# Patient Record
Sex: Male | Born: 1980 | Race: White | Hispanic: No | Marital: Married | Smoking: Current some day smoker
Health system: Southern US, Community
[De-identification: ages and names within clinical notes are randomized; demographics above are authoritative.]

## PROBLEM LIST (undated history)

## (undated) DIAGNOSIS — N200 Calculus of kidney: Secondary | ICD-10-CM

## (undated) DIAGNOSIS — Z7289 Other problems related to lifestyle: Secondary | ICD-10-CM

## (undated) DIAGNOSIS — F32A Depression, unspecified: Secondary | ICD-10-CM

## (undated) DIAGNOSIS — F419 Anxiety disorder, unspecified: Secondary | ICD-10-CM

## (undated) DIAGNOSIS — F1910 Other psychoactive substance abuse, uncomplicated: Secondary | ICD-10-CM

## (undated) DIAGNOSIS — G4730 Sleep apnea, unspecified: Secondary | ICD-10-CM

## (undated) DIAGNOSIS — Z0282 Encounter for adoption services: Secondary | ICD-10-CM

## (undated) DIAGNOSIS — R51 Headache: Secondary | ICD-10-CM

## (undated) DIAGNOSIS — K219 Gastro-esophageal reflux disease without esophagitis: Secondary | ICD-10-CM

## (undated) DIAGNOSIS — Z87442 Personal history of urinary calculi: Secondary | ICD-10-CM

## (undated) DIAGNOSIS — Z789 Other specified health status: Secondary | ICD-10-CM

## (undated) DIAGNOSIS — F329 Major depressive disorder, single episode, unspecified: Secondary | ICD-10-CM

## (undated) DIAGNOSIS — C801 Malignant (primary) neoplasm, unspecified: Secondary | ICD-10-CM

## (undated) DIAGNOSIS — I10 Essential (primary) hypertension: Secondary | ICD-10-CM

## (undated) DIAGNOSIS — T4145XA Adverse effect of unspecified anesthetic, initial encounter: Secondary | ICD-10-CM

## (undated) DIAGNOSIS — R519 Headache, unspecified: Secondary | ICD-10-CM

## (undated) DIAGNOSIS — G510 Bell's palsy: Secondary | ICD-10-CM

## (undated) DIAGNOSIS — E291 Testicular hypofunction: Secondary | ICD-10-CM

## (undated) DIAGNOSIS — N50819 Testicular pain, unspecified: Secondary | ICD-10-CM

## (undated) DIAGNOSIS — T7840XA Allergy, unspecified, initial encounter: Secondary | ICD-10-CM

## (undated) DIAGNOSIS — T8859XA Other complications of anesthesia, initial encounter: Secondary | ICD-10-CM

## (undated) DIAGNOSIS — M1990 Unspecified osteoarthritis, unspecified site: Secondary | ICD-10-CM

## (undated) DIAGNOSIS — F319 Bipolar disorder, unspecified: Secondary | ICD-10-CM

## (undated) HISTORY — DX: Bipolar disorder, unspecified: F31.9

## (undated) HISTORY — DX: Allergy, unspecified, initial encounter: T78.40XA

## (undated) HISTORY — DX: Other specified health status: Z78.9

## (undated) HISTORY — DX: Depression, unspecified: F32.A

## (undated) HISTORY — DX: Other problems related to lifestyle: Z72.89

## (undated) HISTORY — DX: Testicular hypofunction: E29.1

## (undated) HISTORY — DX: Calculus of kidney: N20.0

## (undated) HISTORY — DX: Encounter for adoption services: Z02.82

## (undated) HISTORY — DX: Other psychoactive substance abuse, uncomplicated: F19.10

## (undated) HISTORY — DX: Malignant (primary) neoplasm, unspecified: C80.1

## (undated) HISTORY — DX: Major depressive disorder, single episode, unspecified: F32.9

## (undated) HISTORY — DX: Essential (primary) hypertension: I10

## (undated) HISTORY — PX: CHOLECYSTECTOMY: SHX55

## (undated) HISTORY — DX: Testicular pain, unspecified: N50.819

## (undated) HISTORY — DX: Gastro-esophageal reflux disease without esophagitis: K21.9

## (undated) HISTORY — DX: Bell's palsy: G51.0

---

## 2000-12-09 HISTORY — PX: TONSILLECTOMY AND ADENOIDECTOMY: SUR1326

## 2002-07-31 ENCOUNTER — Emergency Department (HOSPITAL_COMMUNITY): Admission: EM | Admit: 2002-07-31 | Discharge: 2002-07-31 | Payer: Self-pay | Admitting: Emergency Medicine

## 2007-11-26 ENCOUNTER — Emergency Department: Payer: Self-pay | Admitting: Emergency Medicine

## 2007-11-26 ENCOUNTER — Other Ambulatory Visit: Payer: Self-pay

## 2008-09-25 ENCOUNTER — Emergency Department: Payer: Self-pay | Admitting: Emergency Medicine

## 2010-07-12 ENCOUNTER — Emergency Department: Payer: Self-pay | Admitting: Emergency Medicine

## 2011-09-01 ENCOUNTER — Emergency Department: Payer: Self-pay | Admitting: Emergency Medicine

## 2011-09-04 ENCOUNTER — Ambulatory Visit: Payer: Self-pay | Admitting: Internal Medicine

## 2011-09-19 ENCOUNTER — Ambulatory Visit: Payer: Self-pay | Admitting: Cardiology

## 2012-01-21 ENCOUNTER — Ambulatory Visit: Payer: Self-pay | Admitting: Cardiology

## 2012-03-04 ENCOUNTER — Inpatient Hospital Stay: Payer: Self-pay | Admitting: Psychiatry

## 2012-03-04 LAB — COMPREHENSIVE METABOLIC PANEL
Albumin: 4.3 g/dL (ref 3.4–5.0)
Alkaline Phosphatase: 51 U/L (ref 50–136)
BUN: 12 mg/dL (ref 7–18)
Calcium, Total: 8.7 mg/dL (ref 8.5–10.1)
Chloride: 106 mmol/L (ref 98–107)
Co2: 25 mmol/L (ref 21–32)
EGFR (African American): >60
Glucose: 106 mg/dL — ABNORMAL HIGH (ref 65–99)
Sodium: 141 mmol/L (ref 136–145)
Total Protein: 7.2 g/dL (ref 6.4–8.2)

## 2012-03-04 LAB — DRUG SCREEN, URINE

## 2012-03-04 LAB — CBC
HCT: 41.8 % (ref 40.0–52.0)
MCH: 27.6 pg (ref 26.0–34.0)
MCHC: 33.8 g/dL (ref 32.0–36.0)
RDW: 13.3 % (ref 11.5–14.5)

## 2012-03-04 LAB — URINALYSIS, COMPLETE
Bacteria: NONE SEEN
Bilirubin,UR: NEGATIVE
Blood: NEGATIVE
Glucose,UR: NEGATIVE mg/dL (ref 0–75)
Ketone: NEGATIVE
Leukocyte Esterase: NEGATIVE
RBC,UR: <1 /HPF (ref 0–5)
Specific Gravity: 1.025 (ref 1.003–1.030)
Squamous Epithelial: 1

## 2012-03-04 LAB — ACETAMINOPHEN LEVEL: Acetaminophen: <2 ug/mL

## 2012-03-04 LAB — SALICYLATE LEVEL: Salicylates, Serum: <1.7 mg/dL

## 2012-03-04 LAB — ETHANOL: Ethanol: <3 mg/dL

## 2012-03-05 LAB — CREATININE, SERUM: Creatinine: 1.22 mg/dL (ref 0.60–1.30)

## 2012-03-07 LAB — COMPREHENSIVE METABOLIC PANEL
Albumin: 3.8 g/dL (ref 3.4–5.0)
BUN: 14 mg/dL (ref 7–18)
Bilirubin,Total: 0.8 mg/dL (ref 0.2–1.0)
Chloride: 104 mmol/L (ref 98–107)
Creatinine: 1.41 mg/dL — ABNORMAL HIGH (ref 0.60–1.30)
EGFR (African American): >60
EGFR (Non-African Amer.): >60
Sodium: 142 mmol/L (ref 136–145)

## 2012-03-07 LAB — VALPROIC ACID LEVEL: Valproic Acid: 103 ug/mL — ABNORMAL HIGH

## 2012-03-10 LAB — VALPROIC ACID LEVEL: Valproic Acid: 108 ug/mL — ABNORMAL HIGH

## 2012-03-22 LAB — COMPREHENSIVE METABOLIC PANEL
Alkaline Phosphatase: 53 U/L (ref 50–136)
Anion Gap: 7 (ref 7–16)
BUN: 14 mg/dL (ref 7–18)
Bilirubin,Total: 0.5 mg/dL (ref 0.2–1.0)
Calcium, Total: 9.2 mg/dL (ref 8.5–10.1)
Chloride: 105 mmol/L (ref 98–107)
Co2: 28 mmol/L (ref 21–32)
Creatinine: 1.39 mg/dL — ABNORMAL HIGH (ref 0.60–1.30)
EGFR (African American): >60
EGFR (Non-African Amer.): >60
Glucose: 102 mg/dL — ABNORMAL HIGH (ref 65–99)
Osmolality: 280 (ref 275–301)
Potassium: 4 mmol/L (ref 3.5–5.1)
SGOT(AST): 21 U/L (ref 15–37)
Sodium: 140 mmol/L (ref 136–145)
Total Protein: 7.5 g/dL (ref 6.4–8.2)

## 2012-03-22 LAB — CBC
HCT: 44 % (ref 40.0–52.0)
HGB: 14.7 g/dL (ref 13.0–18.0)
MCHC: 33.5 g/dL (ref 32.0–36.0)
MCV: 83 fL (ref 80–100)
Platelet: 170 10*3/uL (ref 150–440)
RBC: 5.33 10*6/uL (ref 4.40–5.90)

## 2012-03-22 LAB — ETHANOL: Ethanol %: <0.003 % (ref 0.000–0.080)

## 2012-03-22 LAB — VALPROIC ACID LEVEL: Valproic Acid: 46 ug/mL — ABNORMAL LOW

## 2012-03-23 ENCOUNTER — Inpatient Hospital Stay: Payer: Self-pay | Admitting: Psychiatry

## 2012-03-23 LAB — DRUG SCREEN, URINE
Amphetamines, Ur Screen: NEGATIVE (ref ?–1000)
Barbiturates, Ur Screen: NEGATIVE (ref ?–200)
Benzodiazepine, Ur Scrn: NEGATIVE (ref ?–200)
Cocaine Metabolite,Ur ~~LOC~~: NEGATIVE (ref ?–300)
MDMA (Ecstasy)Ur Screen: POSITIVE (ref ?–500)
Methadone, Ur Screen: NEGATIVE (ref ?–300)
Opiate, Ur Screen: NEGATIVE (ref ?–300)
Tricyclic, Ur Screen: NEGATIVE (ref ?–1000)

## 2012-03-23 LAB — URINALYSIS, COMPLETE
Bacteria: NONE SEEN
Bilirubin,UR: NEGATIVE
Ketone: NEGATIVE
Leukocyte Esterase: NEGATIVE
Nitrite: NEGATIVE
RBC,UR: 1 /HPF (ref 0–5)

## 2012-03-23 LAB — AMMONIA: Ammonia, Plasma: 34 mcmol/L — ABNORMAL HIGH (ref 11–32)

## 2012-03-24 LAB — BASIC METABOLIC PANEL
BUN: 15 mg/dL (ref 7–18)
Calcium, Total: 8.7 mg/dL (ref 8.5–10.1)
Chloride: 105 mmol/L (ref 98–107)
Creatinine: 1.51 mg/dL — ABNORMAL HIGH (ref 0.60–1.30)
EGFR (Non-African Amer.): >60
Glucose: 88 mg/dL (ref 65–99)
Osmolality: 282 (ref 275–301)
Potassium: 4.2 mmol/L (ref 3.5–5.1)

## 2012-03-26 LAB — CBC WITH DIFFERENTIAL/PLATELET
Basophil #: 0 10*3/uL (ref 0.0–0.1)
Eosinophil #: 0.1 10*3/uL (ref 0.0–0.7)
Lymphocyte #: 1.8 10*3/uL (ref 1.0–3.6)
MCH: 27.7 pg (ref 26.0–34.0)
MCHC: 33.9 g/dL (ref 32.0–36.0)
Monocyte #: 0.7 x10 3/mm (ref 0.2–1.0)
Neutrophil #: 3.8 10*3/uL (ref 1.4–6.5)
Neutrophil %: 59.2 %
Platelet: 155 10*3/uL (ref 150–440)
WBC: 6.4 10*3/uL (ref 3.8–10.6)

## 2012-03-26 LAB — HEPATIC FUNCTION PANEL A (ARMC)
Albumin: 3.8 g/dL (ref 3.4–5.0)
Alkaline Phosphatase: 45 U/L — ABNORMAL LOW (ref 50–136)
Bilirubin, Direct: <0.1 mg/dL (ref 0.00–0.20)
SGOT(AST): 12 U/L — ABNORMAL LOW (ref 15–37)
SGPT (ALT): 19 U/L

## 2012-03-30 ENCOUNTER — Emergency Department: Payer: Self-pay | Admitting: Emergency Medicine

## 2012-05-25 ENCOUNTER — Ambulatory Visit: Payer: Self-pay | Admitting: Internal Medicine

## 2012-06-19 ENCOUNTER — Ambulatory Visit: Payer: Self-pay | Admitting: Internal Medicine

## 2013-02-17 ENCOUNTER — Ambulatory Visit: Payer: Self-pay | Admitting: Gastroenterology

## 2013-02-18 ENCOUNTER — Ambulatory Visit: Payer: Self-pay | Admitting: Specialist

## 2013-02-27 ENCOUNTER — Ambulatory Visit: Payer: Self-pay | Admitting: Internal Medicine

## 2013-02-27 LAB — URINALYSIS, COMPLETE
Blood: NEGATIVE
Glucose,UR: NEGATIVE mg/dL (ref 0–75)
Ketone: NEGATIVE
Leukocyte Esterase: NEGATIVE
Nitrite: NEGATIVE
RBC,UR: NONE SEEN /HPF (ref 0–5)
Squamous Epithelial: NONE SEEN

## 2013-02-27 LAB — COMPREHENSIVE METABOLIC PANEL
Albumin: 4.3 g/dL (ref 3.4–5.0)
Bilirubin,Total: 1 mg/dL (ref 0.2–1.0)
Calcium, Total: 8.7 mg/dL (ref 8.5–10.1)
Chloride: 102 mmol/L (ref 98–107)
Co2: 25 mmol/L (ref 21–32)
EGFR (African American): >60
EGFR (Non-African Amer.): 55 — ABNORMAL LOW
Glucose: 97 mg/dL (ref 65–99)
Osmolality: 283 (ref 275–301)
SGPT (ALT): 39 U/L (ref 12–78)
Sodium: 141 mmol/L (ref 136–145)
Total Protein: 7.7 g/dL (ref 6.4–8.2)

## 2013-02-27 LAB — CBC WITH DIFFERENTIAL/PLATELET
Basophil #: 0.1 10*3/uL (ref 0.0–0.1)
Basophil %: 1.4 %
HCT: 44.2 % (ref 40.0–52.0)
Lymphocyte %: 34.9 %
MCH: 26.6 pg (ref 26.0–34.0)
MCHC: 34 g/dL (ref 32.0–36.0)
MCV: 78 fL — ABNORMAL LOW (ref 80–100)
Neutrophil #: 2.7 10*3/uL (ref 1.4–6.5)
Neutrophil %: 47.9 %
RDW: 14.2 % (ref 11.5–14.5)

## 2013-02-27 LAB — LIPASE, BLOOD: Lipase: 87 U/L (ref 73–393)

## 2013-02-27 LAB — CLOSTRIDIUM DIFFICILE BY PCR

## 2013-02-28 LAB — WBCS, STOOL

## 2013-03-03 ENCOUNTER — Ambulatory Visit: Payer: Self-pay | Admitting: Gastroenterology

## 2013-03-03 LAB — DRUG SCREEN, URINE
Barbiturates, Ur Screen: NEGATIVE (ref ?–200)
Benzodiazepine, Ur Scrn: NEGATIVE (ref ?–200)
Cannabinoid 50 Ng, Ur ~~LOC~~: NEGATIVE (ref ?–50)
Cocaine Metabolite,Ur ~~LOC~~: NEGATIVE (ref ?–300)
MDMA (Ecstasy)Ur Screen: NEGATIVE (ref ?–500)
Opiate, Ur Screen: NEGATIVE (ref ?–300)
Phencyclidine (PCP) Ur S: NEGATIVE (ref ?–25)

## 2013-03-17 ENCOUNTER — Ambulatory Visit: Payer: Self-pay | Admitting: Surgery

## 2013-03-31 ENCOUNTER — Ambulatory Visit: Payer: Self-pay | Admitting: Surgery

## 2013-04-01 LAB — PATHOLOGY REPORT

## 2013-12-09 DIAGNOSIS — G510 Bell's palsy: Secondary | ICD-10-CM

## 2013-12-09 HISTORY — DX: Bell's palsy: G51.0

## 2014-03-14 ENCOUNTER — Emergency Department: Payer: Self-pay | Admitting: Emergency Medicine

## 2014-03-14 LAB — COMPREHENSIVE METABOLIC PANEL
ALK PHOS: 54 U/L
ALT: 40 U/L (ref 12–78)
Albumin: 4.1 g/dL (ref 3.4–5.0)
Anion Gap: 6 — ABNORMAL LOW (ref 7–16)
BILIRUBIN TOTAL: 0.5 mg/dL (ref 0.2–1.0)
BUN: 16 mg/dL (ref 7–18)
CALCIUM: 8.3 mg/dL — AB (ref 8.5–10.1)
CHLORIDE: 109 mmol/L — AB (ref 98–107)
CREATININE: 1.49 mg/dL — AB (ref 0.60–1.30)
Co2: 24 mmol/L (ref 21–32)
GLUCOSE: 107 mg/dL — AB (ref 65–99)
Osmolality: 279 (ref 275–301)
POTASSIUM: 3.6 mmol/L (ref 3.5–5.1)
SGOT(AST): 27 U/L (ref 15–37)
Sodium: 139 mmol/L (ref 136–145)
Total Protein: 7.6 g/dL (ref 6.4–8.2)

## 2014-03-14 LAB — DRUG SCREEN, URINE
Amphetamines, Ur Screen: NEGATIVE (ref ?–1000)
BENZODIAZEPINE, UR SCRN: NEGATIVE (ref ?–200)
Barbiturates, Ur Screen: NEGATIVE (ref ?–200)
CANNABINOID 50 NG, UR ~~LOC~~: NEGATIVE (ref ?–50)
COCAINE METABOLITE, UR ~~LOC~~: NEGATIVE (ref ?–300)
MDMA (Ecstasy)Ur Screen: NEGATIVE (ref ?–500)
METHADONE, UR SCREEN: NEGATIVE (ref ?–300)
Opiate, Ur Screen: NEGATIVE (ref ?–300)
PHENCYCLIDINE (PCP) UR S: NEGATIVE (ref ?–25)
Tricyclic, Ur Screen: NEGATIVE (ref ?–1000)

## 2014-03-14 LAB — ETHANOL: Ethanol: <3 mg/dL

## 2014-03-14 LAB — SALICYLATE LEVEL

## 2014-03-14 LAB — CBC
HCT: 43.1 % (ref 40.0–52.0)
HGB: 14.9 g/dL (ref 13.0–18.0)
MCH: 27.6 pg (ref 26.0–34.0)
MCHC: 34.4 g/dL (ref 32.0–36.0)
MCV: 80 fL (ref 80–100)
Platelet: 185 10*3/uL (ref 150–440)
RBC: 5.37 10*6/uL (ref 4.40–5.90)
RDW: 14 % (ref 11.5–14.5)
WBC: 8.1 10*3/uL (ref 3.8–10.6)

## 2014-03-14 LAB — TSH: Thyroid Stimulating Horm: 3.84 u[IU]/mL

## 2014-03-14 LAB — ACETAMINOPHEN LEVEL

## 2014-03-14 LAB — LITHIUM LEVEL: Lithium: <0.2 mmol/L — ABNORMAL LOW

## 2014-04-13 DIAGNOSIS — F319 Bipolar disorder, unspecified: Secondary | ICD-10-CM | POA: Insufficient documentation

## 2014-04-13 DIAGNOSIS — I10 Essential (primary) hypertension: Secondary | ICD-10-CM | POA: Insufficient documentation

## 2014-04-14 DIAGNOSIS — E349 Endocrine disorder, unspecified: Secondary | ICD-10-CM | POA: Insufficient documentation

## 2014-04-14 DIAGNOSIS — Z79899 Other long term (current) drug therapy: Secondary | ICD-10-CM | POA: Insufficient documentation

## 2014-04-14 DIAGNOSIS — I10 Essential (primary) hypertension: Secondary | ICD-10-CM | POA: Insufficient documentation

## 2014-05-28 IMAGING — US ABDOMEN ULTRASOUND
1 series · 14 of 25 positions shown · non-contrast
Comparison: none

REASON FOR EXAM: RUQ abd pain abn GI xray
COMMENTS:

[Series 1: abdomen ultrasound · 0.33mm/px · 14 of 91 slices shown]
[im 1/91]
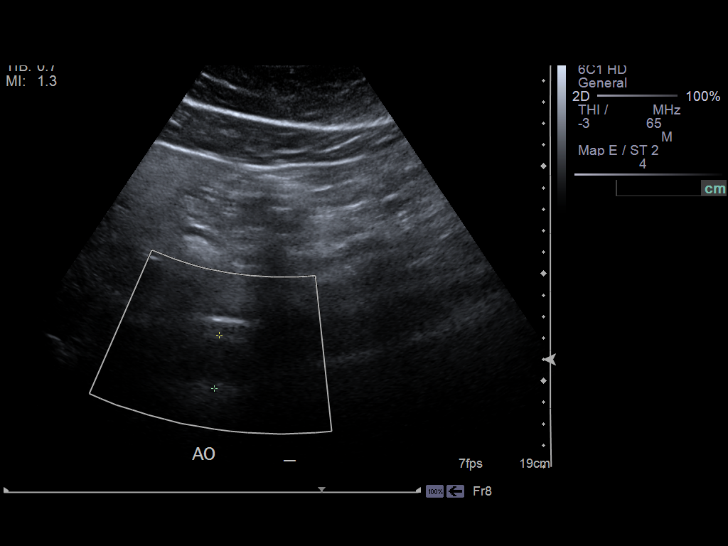
[im 8/91]
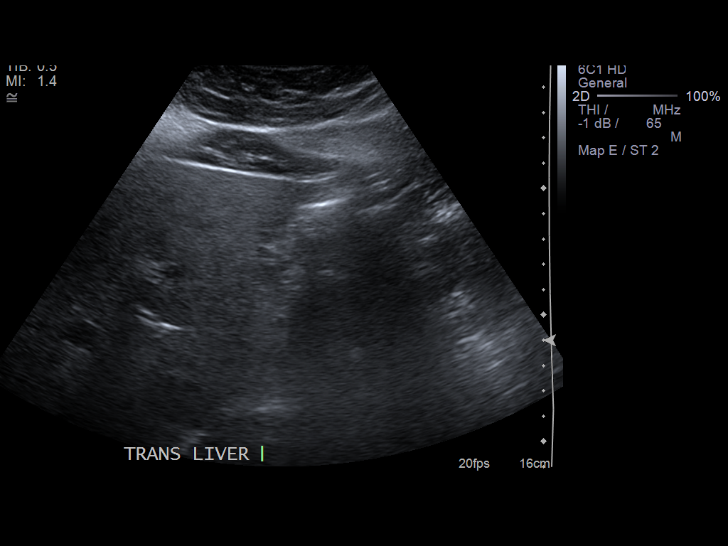
[im 16/91]
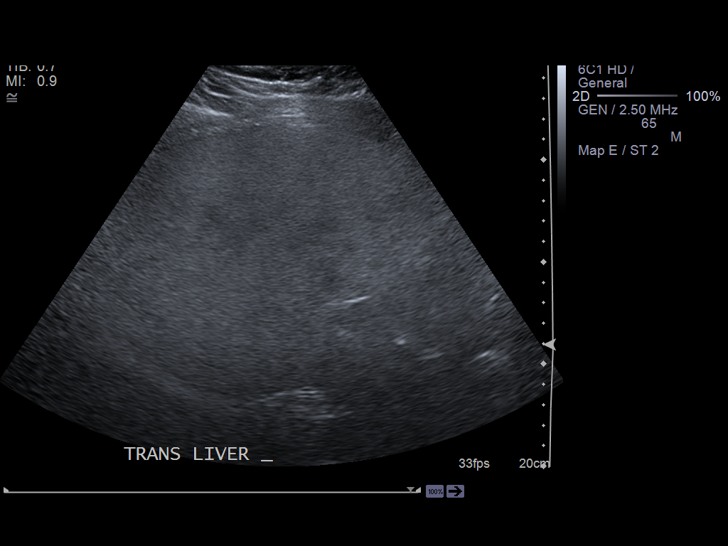
[im 23/91]
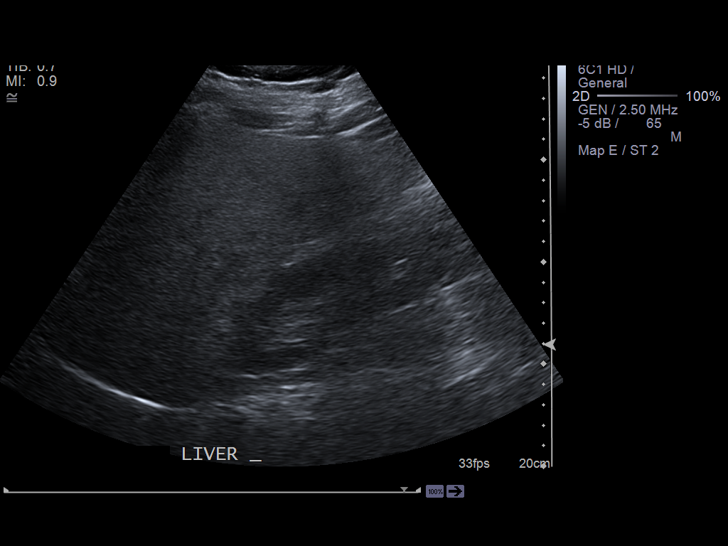
[im 31/91]
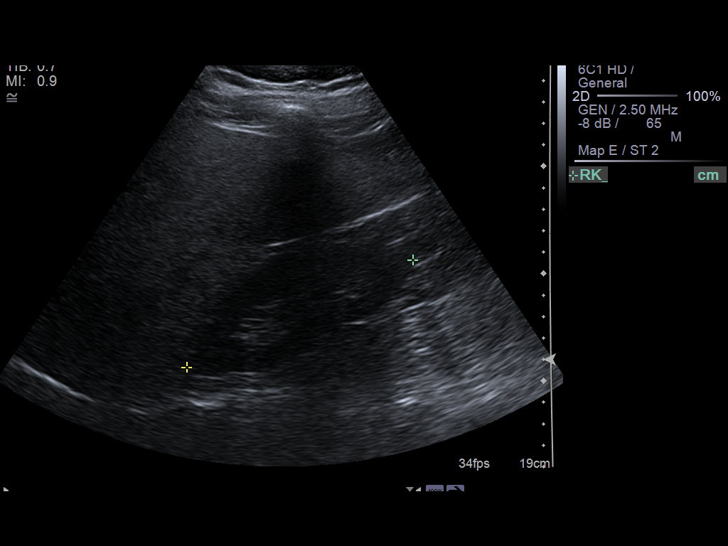
[im 34/91]
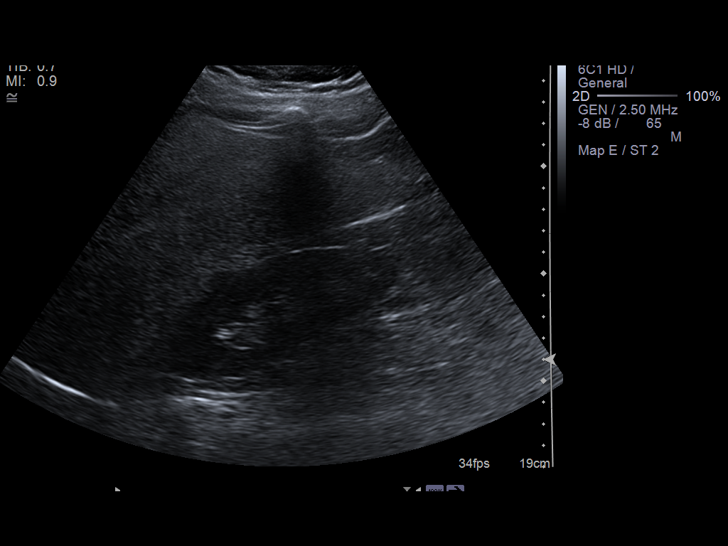
[im 42/91]
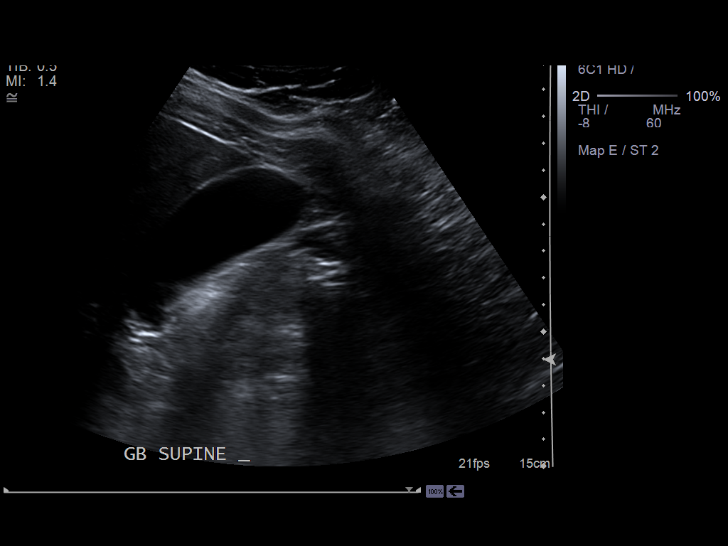
[im 49/91]
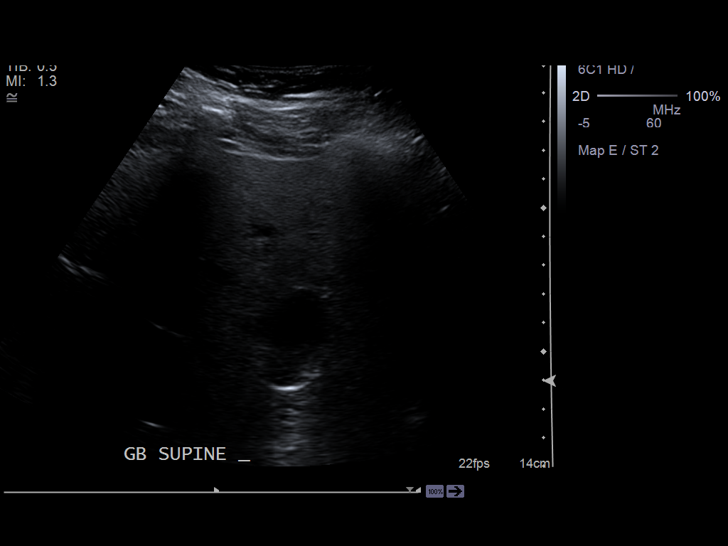
[im 57/91]
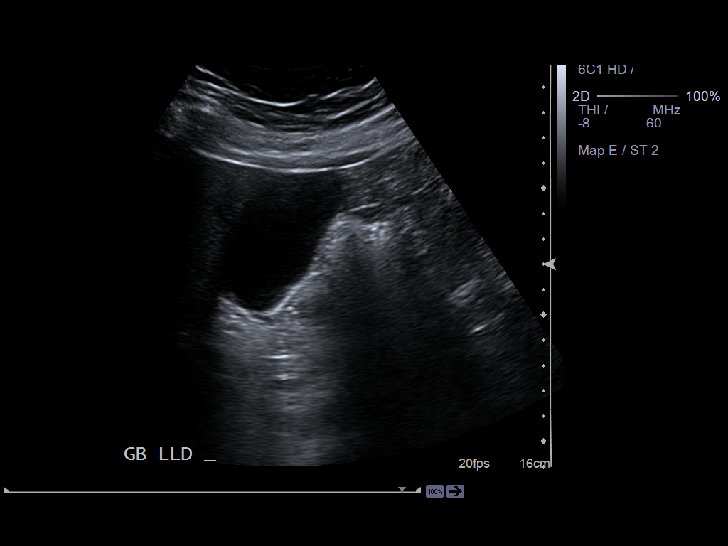
[im 61/91]
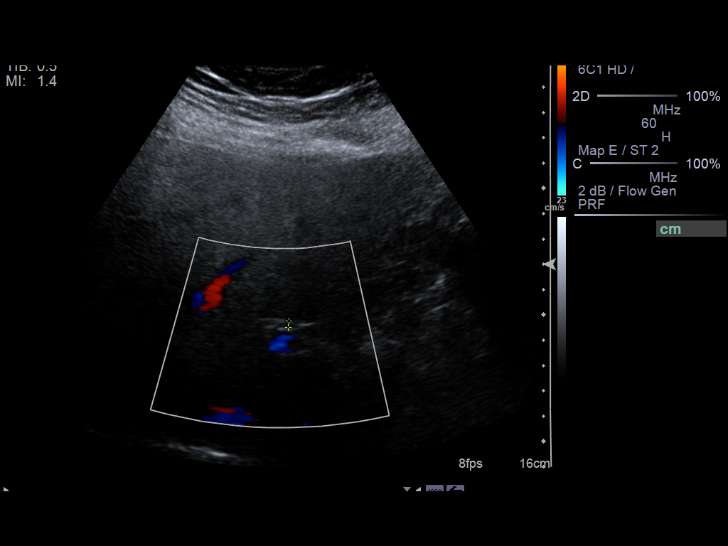
[im 68/91]
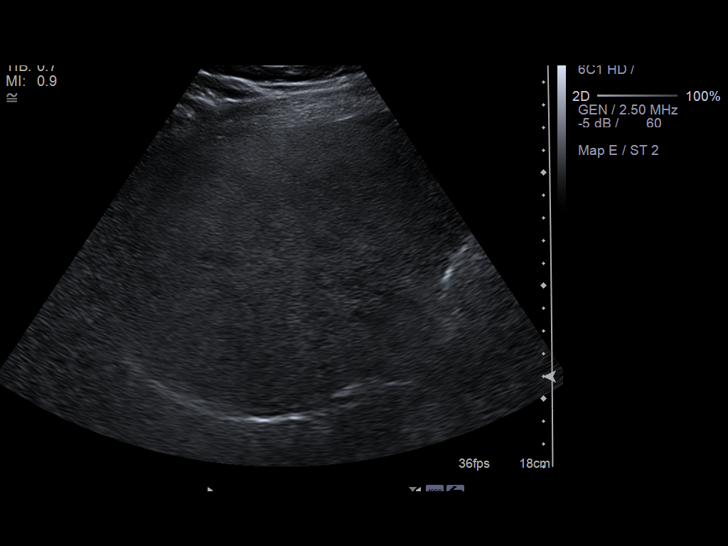
[im 76/91]
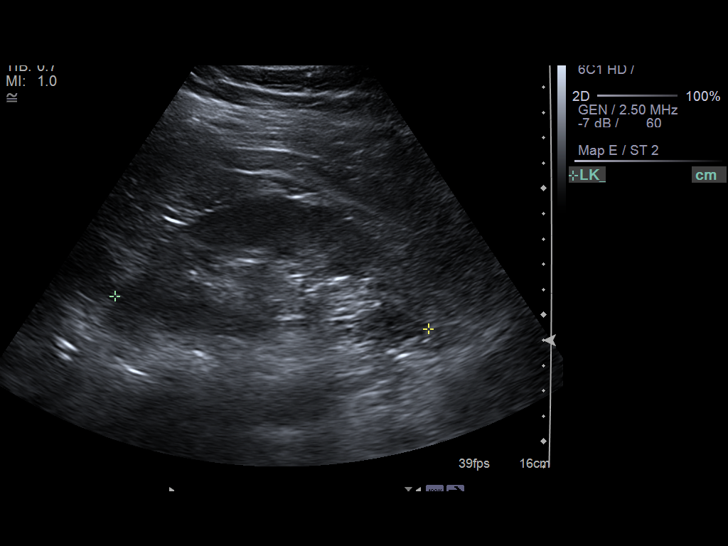
[im 83/91]
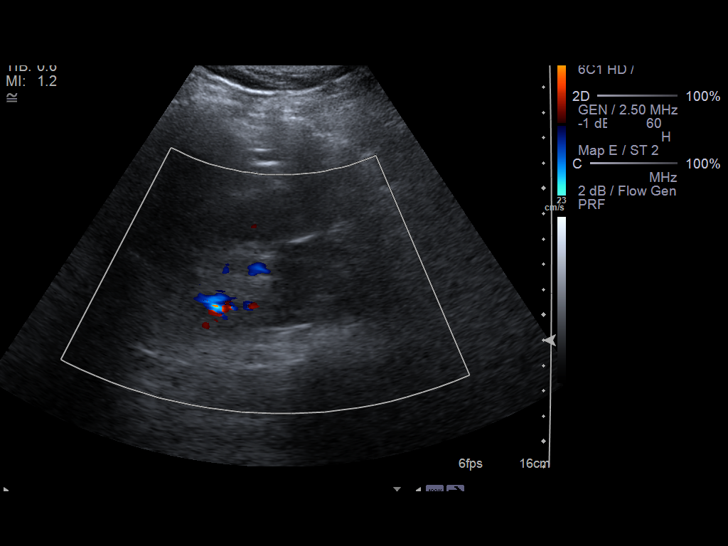
[im 91/91]
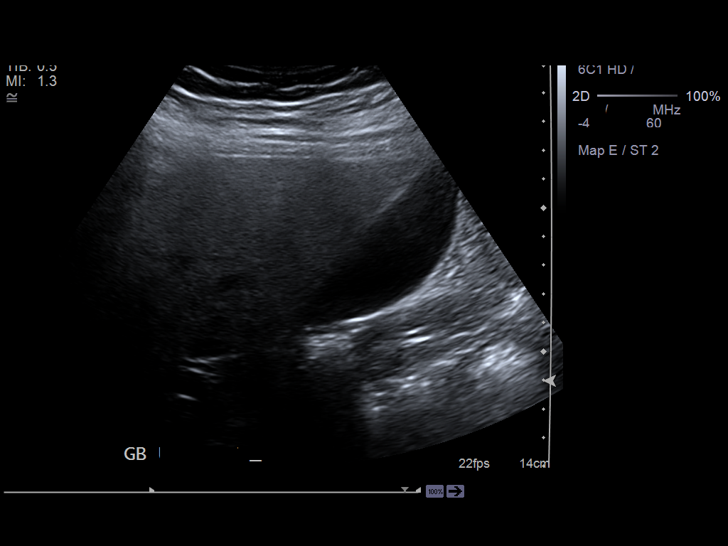

[14 of 25 positions shown; findings below may reference images not displayed]

PROCEDURE:     US  - US ABDOMEN GENERAL SURVEY  - February 17, 2013  [DATE]

RESULT:     Ultrasound of the abdomen is performed in the standard fashion.
The patient has previous examination from 25 May, 2012 for comparison.

The aorta is normal in caliber and appears to taper distally. The pancreas
is obscured by overlying bowel gas. The hepatic echotexture is extremely
echogenic and difficult to penetrate. The liver length is 19.39 cm. The
proximal inferior vena cava appears patent. The right kidney measures
by 5.31 x 6.76 cm. The gallbladder is fluid-filled without a definite stone.
There appear to be multiple gallbladder polyps. The gallbladder wall
thickness is 1.3 mm. The largest polyp appears to measure up to 8.7 mm. The
common bile duct diameter measures 2.5 to 3.1 mm. Spleen is normal in
echotexture with a length of 12.16 cm. The left kidney measures 12.46 x
x 5.21 cm.
IMPRESSION: 1. Echogenic liver difficult to penetrate. Mild hepatomegaly at 19.39 cm
length.
2. Multiple gallbladder polyps as described. No other significant
abnormality evident.

[REDACTED]

## 2014-06-25 IMAGING — CT CT ABD-PELV W/O CM
1 of 2 series · 15 of 32 positions shown, 19 images · non-contrast
Comparison: None

REASON FOR EXAM: REDICAT contrast only allergic shellfish RUQ AND RLQ ABD
PAIN AND DIARRHEA
COMMENTS:

PROCEDURE:     CT  - CT ABDOMEN AND PELVIS W[DATE]  [DATE]
RESULT:     Indication: Pain
TECHNIQUE: Multiple axial images from the lung bases to the symphysis pubis
were obtained with oral and without intravenous contrast.

[Series 2: soft tissue · axial · 0.88mm/px · z∈[-646,-174]mm · 15 of 171 slices shown, 19 images]
[im 7/171  soft-tissue]
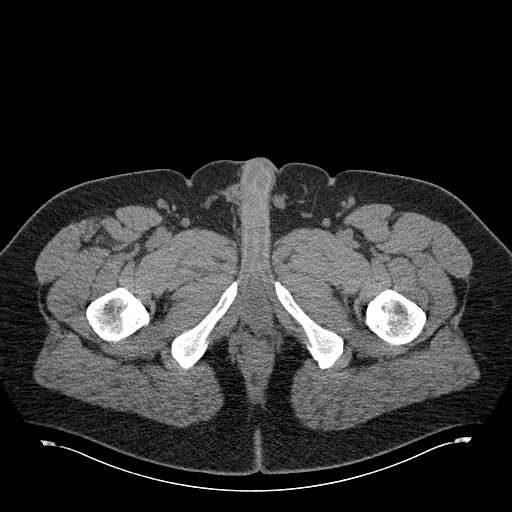
[im 7/171  bone]
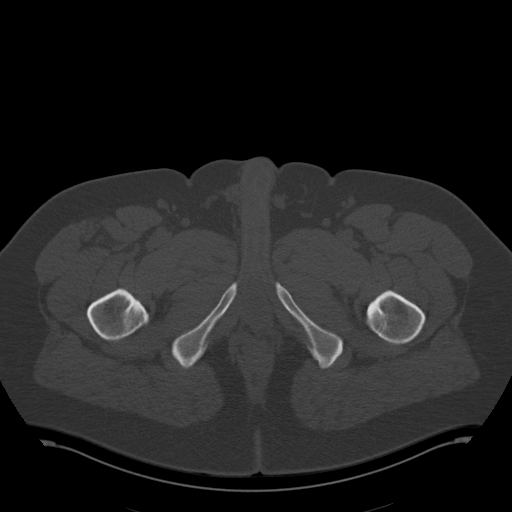
[im 21/171  soft-tissue]
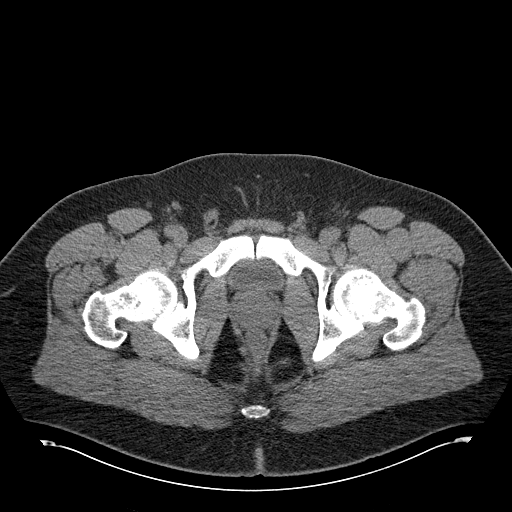
[im 35/171  soft-tissue]
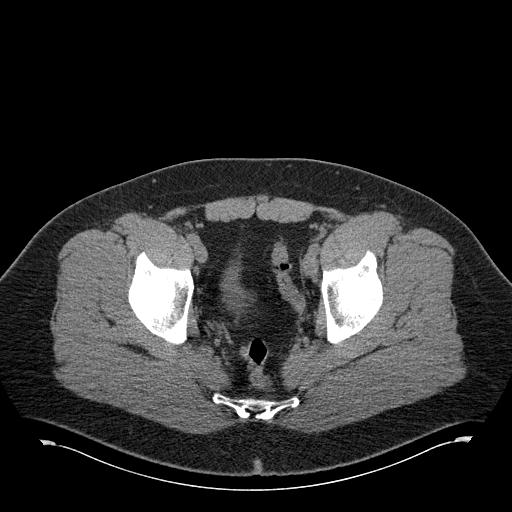
[im 48/171  soft-tissue]
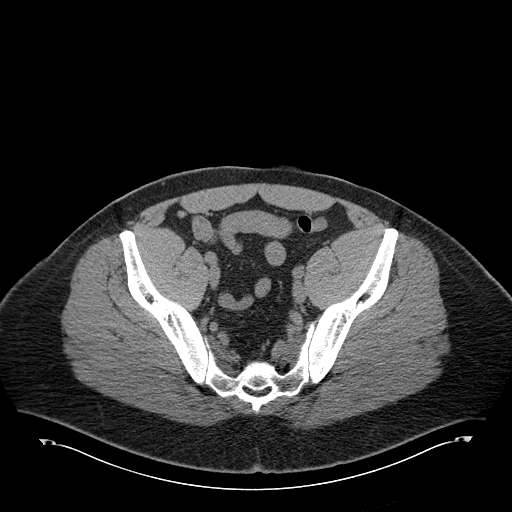
[im 62/171  soft-tissue]
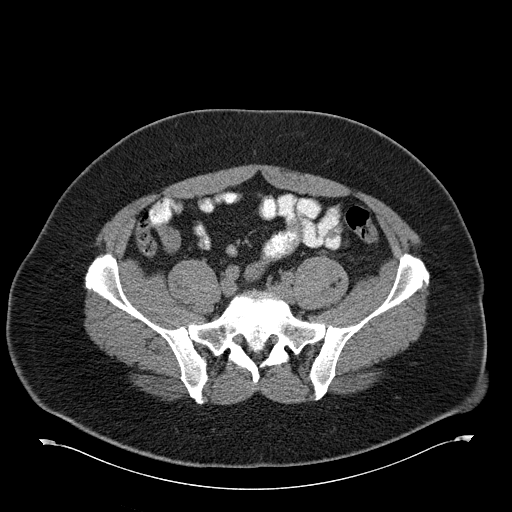
[im 75/171  soft-tissue]
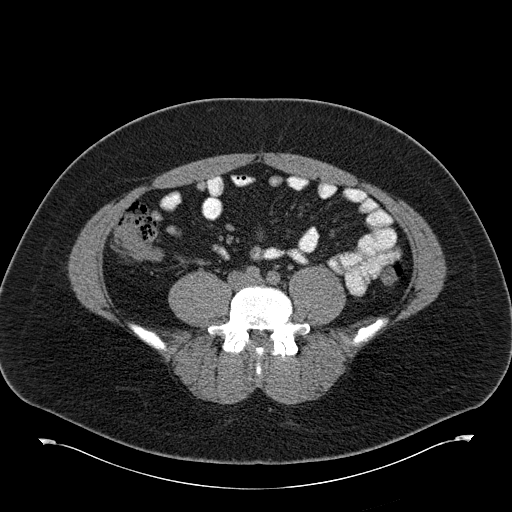
[im 89/171  soft-tissue]
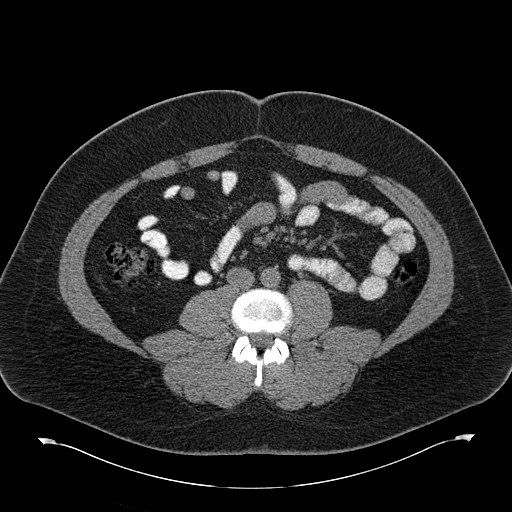
[im 96/171  soft-tissue]
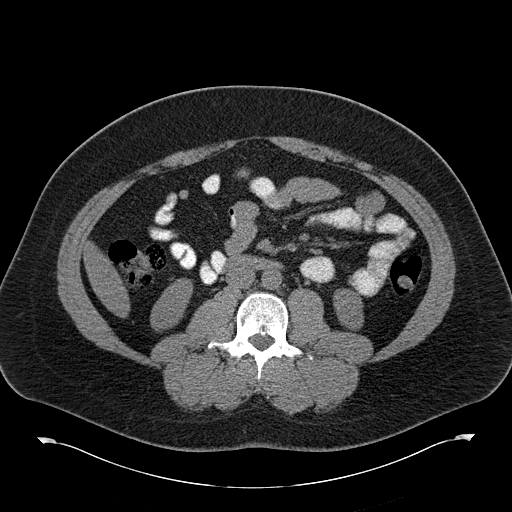
[im 109/171  soft-tissue]
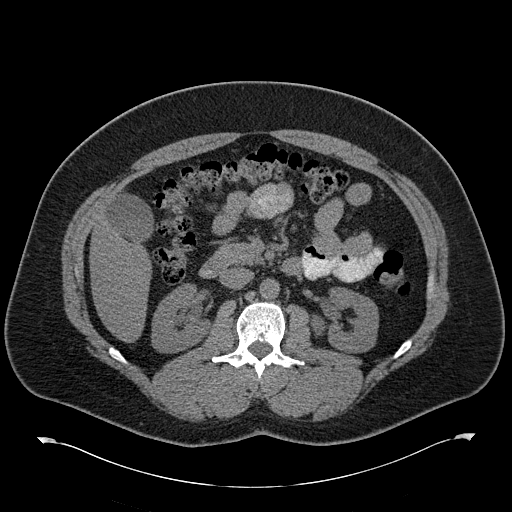
[im 109/171  bone]
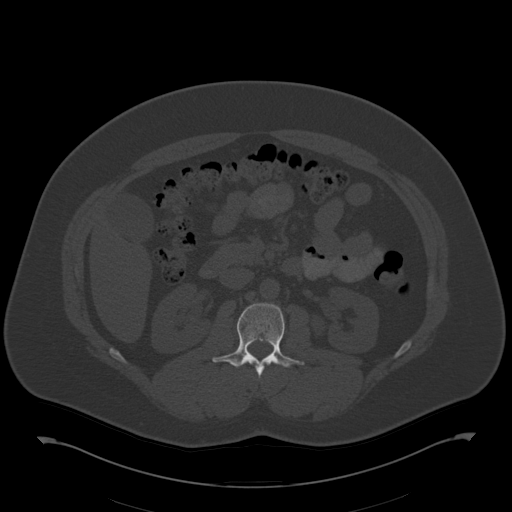
[im 123/171  soft-tissue]
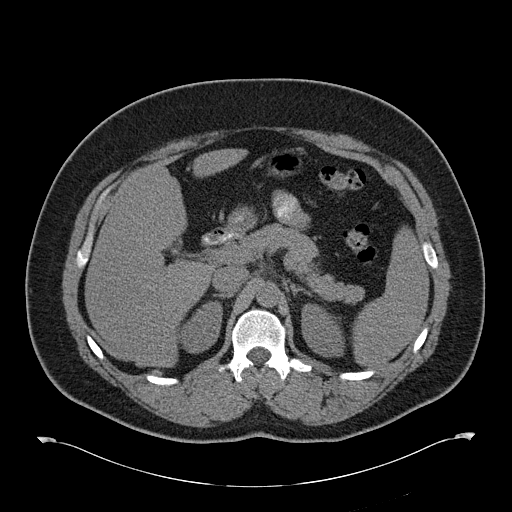
[im 137/171  soft-tissue]
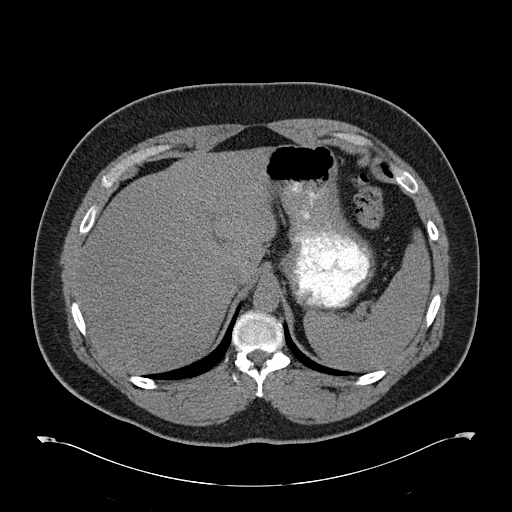
[im 143/171  lung]
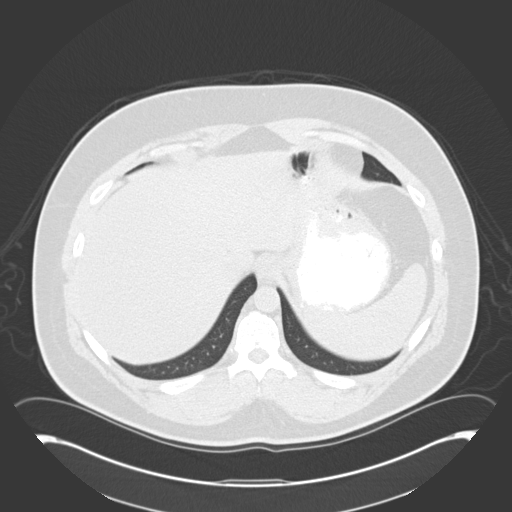
[im 150/171  soft-tissue]
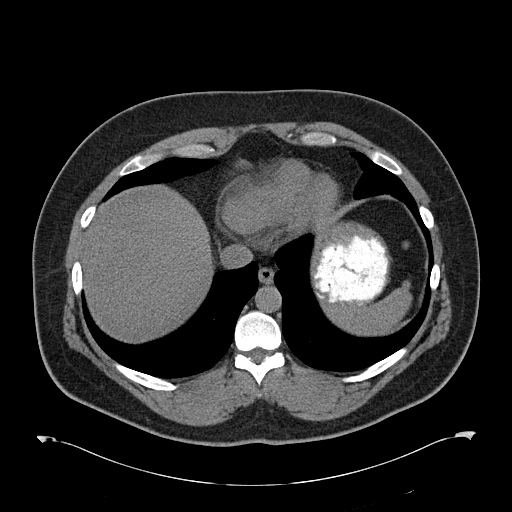
[im 150/171  lung]
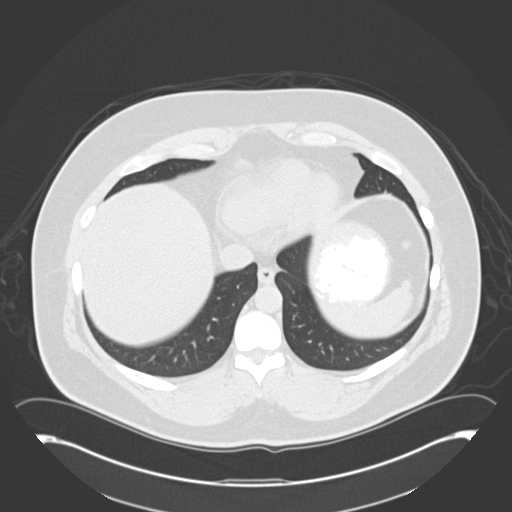
[im 157/171  lung]
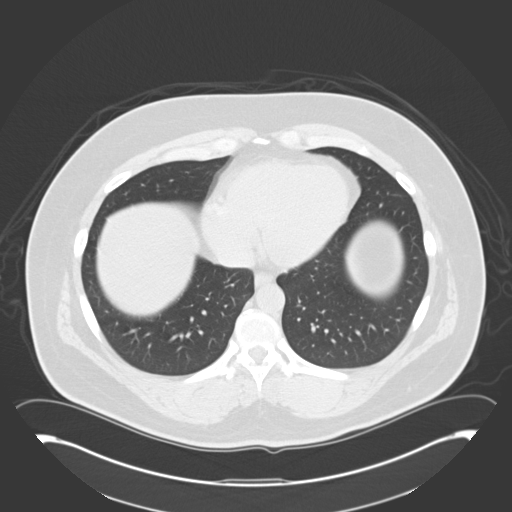
[im 164/171  soft-tissue]
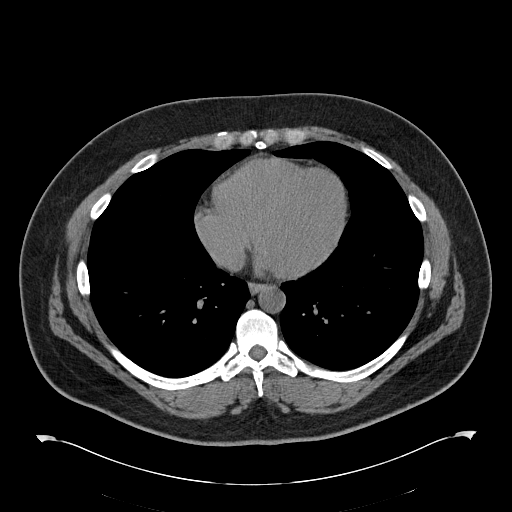
[im 164/171  lung]
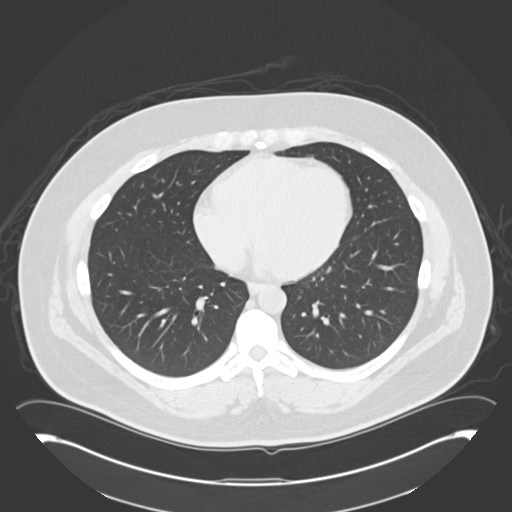

[15 of 32 positions shown; findings below may reference images not displayed]

FINDINGS: The lung bases are clear. There is no pleural or pericardial effusions.

No renal, ureteral, or bladder calculi. No obstructive uropathy. No
perinephric stranding is seen. The kidneys are symmetric in size without
evidence for exophytic mass. The bladder is unremarkable.

The liver demonstrates no focal abnormality. The gallbladder is
unremarkable. The spleen demonstrates no focal abnormality. The adrenal
glands and pancreas are normal.

The  stomach, duodenum, small intestine, and large intestine are
unremarkable. There is a normal caliber appendix in the right lower quadrant
without periappendiceal inflammatory changes. There is no pneumoperitoneum,
pneumatosis, or portal venous gas. There is no abdominal or pelvic free
fluid. There is no lymphadenopathy.

The abdominal aorta is normal in caliber .

The osseous structures are unremarkable.
IMPRESSION: 1. No acute abdominal or pelvic pathology.

[REDACTED]

## 2014-08-07 ENCOUNTER — Emergency Department: Payer: Self-pay | Admitting: Emergency Medicine

## 2014-08-07 LAB — CBC WITH DIFFERENTIAL/PLATELET
Basophil #: 0.1 10*3/uL (ref 0.0–0.1)
Basophil %: 0.9 %
Eosinophil #: 0.1 10*3/uL (ref 0.0–0.7)
Eosinophil %: 1.4 %
HCT: 43.4 % (ref 40.0–52.0)
HGB: 14.5 g/dL (ref 13.0–18.0)
LYMPHS ABS: 2.7 10*3/uL (ref 1.0–3.6)
LYMPHS PCT: 37 %
MCH: 27.6 pg (ref 26.0–34.0)
MCHC: 33.5 g/dL (ref 32.0–36.0)
MCV: 83 fL (ref 80–100)
MONO ABS: 0.5 x10 3/mm (ref 0.2–1.0)
MONOS PCT: 6.7 %
NEUTROS PCT: 54 %
Neutrophil #: 4 10*3/uL (ref 1.4–6.5)
Platelet: 194 10*3/uL (ref 150–440)
RBC: 5.27 10*6/uL (ref 4.40–5.90)
RDW: 13.6 % (ref 11.5–14.5)
WBC: 7.4 10*3/uL (ref 3.8–10.6)

## 2014-08-07 LAB — COMPREHENSIVE METABOLIC PANEL
Albumin: 4 g/dL (ref 3.4–5.0)
Alkaline Phosphatase: 47 U/L
Anion Gap: 9 (ref 7–16)
BUN: 9 mg/dL (ref 7–18)
Bilirubin,Total: 1 mg/dL (ref 0.2–1.0)
Calcium, Total: 8.4 mg/dL — ABNORMAL LOW (ref 8.5–10.1)
Chloride: 110 mmol/L — ABNORMAL HIGH (ref 98–107)
Co2: 23 mmol/L (ref 21–32)
Creatinine: 1.32 mg/dL — ABNORMAL HIGH (ref 0.60–1.30)
EGFR (African American): >60
Glucose: 113 mg/dL — ABNORMAL HIGH (ref 65–99)
Osmolality: 283 (ref 275–301)
Potassium: 3.6 mmol/L (ref 3.5–5.1)
SGOT(AST): 33 U/L (ref 15–37)
SGPT (ALT): 38 U/L
Sodium: 142 mmol/L (ref 136–145)
Total Protein: 7.1 g/dL (ref 6.4–8.2)

## 2014-08-07 LAB — PROTIME-INR
INR: 1
Prothrombin Time: 12.7 secs (ref 11.5–14.7)

## 2014-08-07 LAB — TROPONIN I

## 2014-08-07 LAB — LITHIUM LEVEL: Lithium: 0.42 mmol/L — ABNORMAL LOW

## 2014-08-16 DIAGNOSIS — G510 Bell's palsy: Secondary | ICD-10-CM | POA: Insufficient documentation

## 2014-08-17 LAB — BASIC METABOLIC PANEL
ANION GAP: 6 — AB (ref 7–16)
BUN: 21 mg/dL — AB (ref 7–18)
CHLORIDE: 105 mmol/L (ref 98–107)
Calcium, Total: 8.8 mg/dL (ref 8.5–10.1)
Co2: 27 mmol/L (ref 21–32)
Creatinine: 1.25 mg/dL (ref 0.60–1.30)
EGFR (African American): >60
GLUCOSE: 122 mg/dL — AB (ref 65–99)
OSMOLALITY: 280 (ref 275–301)
Potassium: 3.7 mmol/L (ref 3.5–5.1)
Sodium: 138 mmol/L (ref 136–145)

## 2014-08-17 LAB — CBC
HCT: 44.9 % (ref 40.0–52.0)
HGB: 14.7 g/dL (ref 13.0–18.0)
MCH: 27.2 pg (ref 26.0–34.0)
MCHC: 32.8 g/dL (ref 32.0–36.0)
MCV: 83 fL (ref 80–100)
Platelet: 190 10*3/uL (ref 150–440)
RBC: 5.41 10*6/uL (ref 4.40–5.90)
RDW: 13.9 % (ref 11.5–14.5)
WBC: 11 10*3/uL — AB (ref 3.8–10.6)

## 2014-08-17 LAB — ETHANOL

## 2014-08-18 ENCOUNTER — Observation Stay: Payer: Self-pay | Admitting: Internal Medicine

## 2014-08-18 LAB — DRUG SCREEN, URINE

## 2014-08-18 LAB — PROTIME-INR
INR: 1
Prothrombin Time: 13.2 secs (ref 11.5–14.7)

## 2014-08-18 LAB — APTT: Activated PTT: 29.9 secs (ref 23.6–35.9)

## 2014-08-18 LAB — SEDIMENTATION RATE: Erythrocyte Sed Rate: 1 mm/hr (ref 0–15)

## 2014-08-18 LAB — LITHIUM LEVEL: Lithium: 0.59 mmol/L — ABNORMAL LOW

## 2014-08-19 LAB — CBC WITH DIFFERENTIAL/PLATELET
BASOS ABS: 0.1 10*3/uL (ref 0.0–0.1)
Basophil %: 0.5 %
EOS ABS: 0.2 10*3/uL (ref 0.0–0.7)
EOS PCT: 1.9 %
HCT: 43.3 % (ref 40.0–52.0)
HGB: 14.7 g/dL (ref 13.0–18.0)
LYMPHS ABS: 3.9 10*3/uL — AB (ref 1.0–3.6)
Lymphocyte %: 40.8 %
MCH: 27.9 pg (ref 26.0–34.0)
MCHC: 34 g/dL (ref 32.0–36.0)
MCV: 82 fL (ref 80–100)
Monocyte #: 0.5 x10 3/mm (ref 0.2–1.0)
Monocyte %: 5.7 %
NEUTROS PCT: 51.1 %
Neutrophil #: 4.9 10*3/uL (ref 1.4–6.5)
Platelet: 187 10*3/uL (ref 150–440)
RBC: 5.27 10*6/uL (ref 4.40–5.90)
RDW: 14 % (ref 11.5–14.5)
WBC: 9.5 10*3/uL (ref 3.8–10.6)

## 2014-08-19 LAB — COMPREHENSIVE METABOLIC PANEL
ALBUMIN: 3.3 g/dL — AB (ref 3.4–5.0)
ALK PHOS: 38 U/L — AB
ALT: 26 U/L
Anion Gap: 7 (ref 7–16)
BUN: 19 mg/dL — ABNORMAL HIGH (ref 7–18)
Bilirubin,Total: 0.6 mg/dL (ref 0.2–1.0)
CHLORIDE: 106 mmol/L (ref 98–107)
Calcium, Total: 8.2 mg/dL — ABNORMAL LOW (ref 8.5–10.1)
Co2: 26 mmol/L (ref 21–32)
Creatinine: 1.35 mg/dL — ABNORMAL HIGH (ref 0.60–1.30)
EGFR (African American): >60
EGFR (Non-African Amer.): >60
Glucose: 86 mg/dL (ref 65–99)
OSMOLALITY: 279 (ref 275–301)
Potassium: 4.1 mmol/L (ref 3.5–5.1)
SGOT(AST): 22 U/L (ref 15–37)
Sodium: 139 mmol/L (ref 136–145)
TOTAL PROTEIN: 6.2 g/dL — AB (ref 6.4–8.2)

## 2014-08-19 LAB — CSF CC+PROT+GLU+CULT PANEL
CSF Tube #: 1
EOS PCT: 0 %
Glucose, CSF: 53 mg/dL (ref 40–75)
LYMPHS PCT: 70 %
Monocytes/Macrophages: 26 %
Neutrophils: 5 %
Other Cells: 0 %
PROTEIN, CSF: 49 mg/dL — AB (ref 15–45)
RBC (CSF): 83 /mm3
WBC (CSF): 5 /mm3

## 2014-08-20 LAB — MAGNESIUM: Magnesium: 2 mg/dL

## 2014-08-20 LAB — BASIC METABOLIC PANEL
Anion Gap: 8 (ref 7–16)
BUN: 21 mg/dL — ABNORMAL HIGH (ref 7–18)
CALCIUM: 8.8 mg/dL (ref 8.5–10.1)
CREATININE: 1.42 mg/dL — AB (ref 0.60–1.30)
Chloride: 106 mmol/L (ref 98–107)
Co2: 24 mmol/L (ref 21–32)
EGFR (African American): >60
EGFR (Non-African Amer.): >60
Glucose: 87 mg/dL (ref 65–99)
Osmolality: 278 (ref 275–301)
POTASSIUM: 4.2 mmol/L (ref 3.5–5.1)
SODIUM: 138 mmol/L (ref 136–145)

## 2014-08-21 LAB — CBC WITH DIFFERENTIAL/PLATELET
BASOS ABS: 0 10*3/uL (ref 0.0–0.1)
BASOS PCT: 0.4 %
Eosinophil #: 0.2 10*3/uL (ref 0.0–0.7)
Eosinophil %: 2 %
HCT: 44.7 % (ref 40.0–52.0)
HGB: 14.7 g/dL (ref 13.0–18.0)
Lymphocyte #: 2.7 10*3/uL (ref 1.0–3.6)
Lymphocyte %: 27.4 %
MCH: 27.5 pg (ref 26.0–34.0)
MCHC: 32.9 g/dL (ref 32.0–36.0)
MCV: 84 fL (ref 80–100)
Monocyte #: 0.6 x10 3/mm (ref 0.2–1.0)
Monocyte %: 6.3 %
NEUTROS ABS: 6.3 10*3/uL (ref 1.4–6.5)
Neutrophil %: 63.9 %
PLATELETS: 175 10*3/uL (ref 150–440)
RBC: 5.35 10*6/uL (ref 4.40–5.90)
RDW: 13.6 % (ref 11.5–14.5)
WBC: 9.9 10*3/uL (ref 3.8–10.6)

## 2014-08-21 LAB — COMPREHENSIVE METABOLIC PANEL
ALK PHOS: 51 U/L
Albumin: 3.9 g/dL (ref 3.4–5.0)
Anion Gap: 4 — ABNORMAL LOW (ref 7–16)
BUN: 15 mg/dL (ref 7–18)
Bilirubin,Total: 0.7 mg/dL (ref 0.2–1.0)
CO2: 29 mmol/L (ref 21–32)
CREATININE: 1.47 mg/dL — AB (ref 0.60–1.30)
Calcium, Total: 9.2 mg/dL (ref 8.5–10.1)
Chloride: 103 mmol/L (ref 98–107)
EGFR (African American): >60
Glucose: 97 mg/dL (ref 65–99)
Osmolality: 273 (ref 275–301)
Potassium: 4.5 mmol/L (ref 3.5–5.1)
SGOT(AST): 17 U/L (ref 15–37)
SGPT (ALT): 34 U/L
Sodium: 136 mmol/L (ref 136–145)
TOTAL PROTEIN: 7.1 g/dL (ref 6.4–8.2)

## 2014-08-22 LAB — COMPREHENSIVE METABOLIC PANEL
ALBUMIN: 3.8 g/dL (ref 3.4–5.0)
Alkaline Phosphatase: 47 U/L
Anion Gap: 11 (ref 7–16)
BUN: 18 mg/dL (ref 7–18)
Bilirubin,Total: 0.6 mg/dL (ref 0.2–1.0)
CALCIUM: 9 mg/dL (ref 8.5–10.1)
CREATININE: 1.37 mg/dL — AB (ref 0.60–1.30)
Chloride: 105 mmol/L (ref 98–107)
Co2: 22 mmol/L (ref 21–32)
EGFR (African American): >60
Glucose: 89 mg/dL (ref 65–99)
Osmolality: 277 (ref 275–301)
POTASSIUM: 4.1 mmol/L (ref 3.5–5.1)
SGOT(AST): 40 U/L — ABNORMAL HIGH (ref 15–37)
SGPT (ALT): 45 U/L
SODIUM: 138 mmol/L (ref 136–145)
TOTAL PROTEIN: 7.2 g/dL (ref 6.4–8.2)

## 2014-09-15 ENCOUNTER — Ambulatory Visit: Payer: Self-pay | Admitting: Internal Medicine

## 2014-10-18 DIAGNOSIS — M5126 Other intervertebral disc displacement, lumbar region: Secondary | ICD-10-CM | POA: Insufficient documentation

## 2014-10-18 DIAGNOSIS — M5136 Other intervertebral disc degeneration, lumbar region: Secondary | ICD-10-CM | POA: Insufficient documentation

## 2014-12-15 ENCOUNTER — Ambulatory Visit: Payer: Self-pay

## 2014-12-21 ENCOUNTER — Ambulatory Visit: Payer: Self-pay | Admitting: Nurse Practitioner

## 2014-12-26 ENCOUNTER — Emergency Department: Payer: Self-pay | Admitting: Emergency Medicine

## 2015-01-06 ENCOUNTER — Ambulatory Visit: Payer: No Typology Code available for payment source | Admitting: Nurse Practitioner

## 2015-01-06 ENCOUNTER — Encounter (INDEPENDENT_AMBULATORY_CARE_PROVIDER_SITE_OTHER): Payer: Self-pay

## 2015-01-06 VITALS — BP 138/78 | HR 82 | Temp 98.1°F | Resp 12 | Ht 74.5 in | Wt 295.8 lb

## 2015-01-09 HISTORY — PX: BACK SURGERY: SHX140

## 2015-01-18 ENCOUNTER — Encounter: Payer: Self-pay | Admitting: Endocrinology

## 2015-01-18 ENCOUNTER — Ambulatory Visit (INDEPENDENT_AMBULATORY_CARE_PROVIDER_SITE_OTHER): Payer: No Typology Code available for payment source | Admitting: Endocrinology

## 2015-01-18 VITALS — BP 134/92 | HR 83 | Resp 12 | Ht 74.0 in | Wt 294.5 lb

## 2015-01-18 DIAGNOSIS — R7989 Other specified abnormal findings of blood chemistry: Secondary | ICD-10-CM | POA: Insufficient documentation

## 2015-01-18 DIAGNOSIS — I10 Essential (primary) hypertension: Secondary | ICD-10-CM | POA: Insufficient documentation

## 2015-01-18 DIAGNOSIS — F319 Bipolar disorder, unspecified: Secondary | ICD-10-CM | POA: Insufficient documentation

## 2015-01-18 DIAGNOSIS — M5416 Radiculopathy, lumbar region: Secondary | ICD-10-CM | POA: Insufficient documentation

## 2015-01-18 DIAGNOSIS — E291 Testicular hypofunction: Secondary | ICD-10-CM

## 2015-01-18 DIAGNOSIS — G43909 Migraine, unspecified, not intractable, without status migrainosus: Secondary | ICD-10-CM | POA: Insufficient documentation

## 2015-01-18 NOTE — Progress Notes (Signed)
HPI: David Wheeler is a 34 y.o.-year-old man, referred by his Urologist, at [facility] urology Dr. and PCP, Dr.Sparks at [facility], for evaluation and management of low testosterone.  Reports that he was found to have very low levels of Testosterone about mid July 2015. Levels were 161 ( normal [id number]). Went to urology and started on Androgel- but didn't like that due to gel form- hoping to do injections. Stopped androgel late summer 2015 after using it for about 1 month.  Not on treatment right now since then. Don't have any urology notes for review.  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 Managed by Larene Beach at [facility].   He is married and has one 1 son, 42  Years age. Not wanting any more children now.  Denies symptoms of decreased Testosterone as below.  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  Is dealing with back pain after recent trauma and had a ESI last week. This was his first steroid injection. Also on percocet for 4 days that time.   Denies decreased libido or problems with erections  No trauma to testes, testicular irradiation or surgery No h/o of mumps orchitis/h/o autoimmune ds. No h/o cryptorchidism He grew and went through puberty like his peers No shrinking of testes. No very small testes (<5 ml)- had exam with Urology recently No incomplete/delayed sexual development     No breast discomfort/gynecomastia    No loss of body hair (axillary/pubic)/decreased need for shaving No height loss No abnormal sense of smell  No hot flushes No vision problems No worst HA of his life- has hx migraines- stable No FH of hypogonadism/infertility - is adopted No personal h/o infertility - has children No FH of hemochromatosis or pituitary tumors No excessive weight gain or loss.  No chronic diseases No more than 2 drinks a day of alcohol at a time, and this is rarely No  anabolic steroids use No herbal medicines  No AI ds in his family, no FH of MS. He does not have family history of early cardiac disease. No prior hx OSA or blood clots.    ROS: Review of Systems: [x]  complains of  [  ] denies General:   [  ] Recent weight change [  ] Fatigue  [  ] Loss of appetite Eyes: [  ]  Vision Difficulty [  ]  Eye pain ENT: [  ]  Hearing difficulty [  ]  Difficulty Swallowing CVS: [  ] Chest pain [  ]  Palpitations/Irregular Heart beat [  ]  Shortness of breath lying flat [  ] Swelling of legs Resp: [  ] Frequent Cough [  ] Shortness of Breath  [  ]  Wheezing GI: [  ] Heartburn  [  ] Nausea or Vomiting  [ x ] Diarrhea [  ] Constipation  [  ] Abdominal Pain GU: [  ]  Polyuria  [  ]  nocturia Bones/joints:  [  ]  Muscle aches  [  ] Joint Pain  [  ] Bone pain Skin/Hair/Nails: [  ]  Rash  [  ] New stretch marks [  ]  Itching [  ] Hair loss [  ]  Excessive hair growth Reproduction: [  ] Low sexual desire , [  ]  Women: Menstrual cycle problems [  ]  Women: Breast Discharge [  ]  Men: Difficulty with erections [  ]  Men: Enlarged Breasts CNS: [ x ] Frequent Headaches [  ] Blurry vision [  ] Tremors [  ] Seizures [  ] Loss of consciousness [  ] Localized weakness Endocrine: [  ]  Excess thirst [  ]  Feeling excessively hot [  ]  Feeling excessively cold Heme: [  ]  Easy bruising [  ]  Enlarged glands or lumps in neck Allergy: [  x]  Food allergies [  ] Environmental allergies  I have reviewed the patient's past medical history, family and social history, surgical history, medications and allergies.  Past Medical History  Diagnosis Date  . Depression   . Allergy   . GERD (gastroesophageal reflux disease)   . Hypertension   . Cancer     skin cancer  . Right-sided Bell's palsy   . Alcohol use   . Substance abuse   . Nephrolithiasis    Past Surgical History  Procedure Laterality Date  . Cholecystectomy    . Tonsillectomy and adenoidectomy  2002   No family  history on file. History   Social History  . Marital Status: Married    Spouse Name: N/A  . Number of Children: N/A  . Years of Education: N/A   Occupational History  . Not on file.   Social History Main Topics  . Smoking status: Former Research scientist (life sciences)  . Smokeless tobacco: Never Used  . Alcohol Use: 0.0 oz/week    0 Standard drinks or equivalent per week  . Drug Use: No  . Sexual Activity: Not on file   Other Topics Concern  . Not on file   Social History Narrative  . No narrative on file   No current outpatient prescriptions on file prior to visit.   No current facility-administered medications on file prior to visit.   Allergies  Allergen Reactions  . Shellfish Allergy Anaphylaxis  . Contrast Media  [Iodinated Diagnostic Agents] Hives  . Latex Rash    PE: BP 134/92 mmHg  Pulse 83  Resp 12  Ht 6' 2"  (1.88 m)  Wt 294 lb 8 oz (133.584 kg)  BMI 37.80 kg/m2  SpO2 97% Wt Readings from Last 3 Encounters:  01/18/15 294 lb 8 oz (133.584 kg)  01/06/15 295 lb 12.8 oz (134.174 kg)   HEENT: Broussard/AT, EOMI, no icterus, no proptosis, no chemosis, no mild lid lag, no retraction, eyes close completely, gross normal VF testing on confrontation Neck: thyroid gland - smooth, non-tender, no erythema, no tracheal deviation; negative Pemberton's sign; no lymphadenopathy; no bruits Lungs: good air entry, clear bilaterally Heart: S1&S2 normal, regular rate & rhythm; no murmurs, rubs or gallops Abd: soft, NT, ND, no HSM, +BS,  Ext: no tremor in hands bilaterally, no edema, 2+ DP/PT pulses, good muscle mass Neuro: normal gait, 2+ reflexes bilaterally, normal 5/5 strength, no proximal myopathy  Derm: no pretibial myxoedema/skin dryness Genital exam: deferred done recently by Urology  ASSESSMENT: 1. Low testosterone   Problem List Items Addressed This Visit      Other   Low serum testosterone level - Primary    We discussed about Hypogonadism- causes, workup , symptoms and treatment  options. We discussed about effects of various medications on Testosterone levels including recent use of Steroids and opiates.  He has had one prior low Testosterone level, and I am not sure whether his workup was completed prior to the Androgel trial.   - Currently not on treatment with Testosterone  -  Will obtain last notes and labs from Urology for review.  - Eitherways, he is not symptomatic from "low T". 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 -I have asked him to check his labs in the morning the day prior to his workday for confirmation of his diagnosis. We will do this in 1 month due to recent use of steroid.  -He is agreeable to the plan. I Need to confirm his diagnosis first prior to consideration of treatment.    RTC 72month      Relevant Orders   Testosterone   Comp Met (CMET)   TSH   T4, free      Glynnis Gavel PUSHKAR 01/18/2015 1:35 PM

## 2015-01-18 NOTE — Patient Instructions (Addendum)
Monitor symptoms.  Return in 1 month for labs. Please get labs the day prior to your working day. Will obtain labs from PCP and Urology.   Please come back for a follow-up appointment in 3 months

## 2015-01-18 NOTE — Assessment & Plan Note (Addendum)
We discussed about Hypogonadism- causes, workup , symptoms and treatment options. We discussed about effects of various medications on Testosterone levels including recent use of Steroids and opiates.  He has had one prior low Testosterone level, and I am not sure whether his workup was completed prior to the Androgel trial.   - Currently not on treatment with Testosterone  - Will obtain last notes and labs from Urology for review.  - Eitherways, he is not symptomatic from "low T". 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 -I have asked him to check his labs in the morning the day prior to his workday for confirmation of his diagnosis. We will do this in 1 month due to recent use of steroid.  -He is agreeable to the plan. I Need to confirm his diagnosis first prior to consideration of treatment.    RTC 45months

## 2015-01-18 NOTE — Progress Notes (Signed)
Pre visit review using our clinic review tool, if applicable. No additional management support is needed unless otherwise documented below in the visit note. 

## 2015-01-23 ENCOUNTER — Encounter: Payer: Self-pay | Admitting: Nurse Practitioner

## 2015-01-23 NOTE — Progress Notes (Signed)
Cancelled appointment- wrong provider.

## 2015-01-31 DIAGNOSIS — G4733 Obstructive sleep apnea (adult) (pediatric): Secondary | ICD-10-CM | POA: Insufficient documentation

## 2015-01-31 DIAGNOSIS — K219 Gastro-esophageal reflux disease without esophagitis: Secondary | ICD-10-CM | POA: Insufficient documentation

## 2015-02-06 DIAGNOSIS — M5106 Intervertebral disc disorders with myelopathy, lumbar region: Secondary | ICD-10-CM | POA: Insufficient documentation

## 2015-02-06 DIAGNOSIS — E669 Obesity, unspecified: Secondary | ICD-10-CM | POA: Insufficient documentation

## 2015-02-06 DIAGNOSIS — M5430 Sciatica, unspecified side: Secondary | ICD-10-CM | POA: Insufficient documentation

## 2015-02-06 DIAGNOSIS — M5137 Other intervertebral disc degeneration, lumbosacral region: Secondary | ICD-10-CM | POA: Insufficient documentation

## 2015-02-16 ENCOUNTER — Other Ambulatory Visit: Payer: No Typology Code available for payment source

## 2015-02-17 ENCOUNTER — Other Ambulatory Visit (INDEPENDENT_AMBULATORY_CARE_PROVIDER_SITE_OTHER): Payer: No Typology Code available for payment source

## 2015-02-17 DIAGNOSIS — E291 Testicular hypofunction: Secondary | ICD-10-CM

## 2015-02-17 DIAGNOSIS — R7989 Other specified abnormal findings of blood chemistry: Secondary | ICD-10-CM

## 2015-02-17 LAB — COMPREHENSIVE METABOLIC PANEL
ALBUMIN: 4.4 g/dL (ref 3.5–5.2)
ALT: 19 U/L (ref 0–53)
AST: 15 U/L (ref 0–37)
Alkaline Phosphatase: 42 U/L (ref 39–117)
BILIRUBIN TOTAL: 0.7 mg/dL (ref 0.2–1.2)
BUN: 12 mg/dL (ref 6–23)
CO2: 30 meq/L (ref 19–32)
Calcium: 9.2 mg/dL (ref 8.4–10.5)
Chloride: 103 mEq/L (ref 96–112)
Creatinine, Ser: 1.33 mg/dL (ref 0.40–1.50)
GFR: 65.48 mL/min (ref >60.00)
Glucose, Bld: 91 mg/dL (ref 70–99)
POTASSIUM: 4.2 meq/L (ref 3.5–5.1)
Sodium: 137 mEq/L (ref 135–145)
Total Protein: 6.6 g/dL (ref 6.0–8.3)

## 2015-02-17 LAB — TSH: TSH: 3.17 u[IU]/mL (ref 0.35–4.50)

## 2015-02-17 LAB — T4, FREE: FREE T4: 0.92 ng/dL (ref 0.60–1.60)

## 2015-02-17 LAB — TESTOSTERONE: Testosterone: 173.69 ng/dL — ABNORMAL LOW (ref 300.00–890.00)

## 2015-03-31 NOTE — Op Note (Signed)
PATIENT NAME:  David Wheeler, David Wheeler MR#:  [id number] DATE OF BIRTH:  [birth year]  DATE OF PROCEDURE:  03/31/2013  PREOPERATIVE DIAGNOSIS: Chronic cholecystitis.   POSTOPERATIVE DIAGNOSIS: Chronic cholecystitis.   OPERATION: Robotically-assisted laparoscopic cholecystectomy.   SURGEON: Rodena Goldmann III, MD   ANESTHESIA: General.  OPERATIVE PROCEDURE: With the patient in the supine position, after induction of appropriate general anesthesia, the patient's abdomen was prepped with ChloraPrep and draped with sterile towels. The patient was placed in the head down, feet up position. A small infraumbilical incision was made in the standard fashion, carried down bluntly through the subcutaneous tissue. The Veress needle was used to cannulate the peritoneal cavity. CO2 was insufflated to appropriate pressure measurements. When approximately 2.5 liters of CO2 was instilled, the Veress needle was withdrawn, and a 12 mm Ethicon port was inserted into the peritoneal cavity. Intraperitoneal position was confirmed, and CO2 was re-insufflated. Three lateral ports 8.5 mm in size were inserted and appropriately positioned. The da Vinci robot device was then brought to the table and docked to the arms with the instruments placed in the appropriate position. Docking time was 8 minutes to do docking, with 9 more minutes until instrument position was appropriate. The surgeon then went to the console. I took over the controls of the robot at that point and began the gallbladder dissection. The gallbladder was lifted superiorly and laterally, exposing the hepatoduodenal ligament. The cystic artery and cystic duct were identified. The critical view was completed. The cystic duct was doubly clipped on the common duct side and divided. The cystic artery was clipped and divided. The gallbladder was then dissected free of its bed and delivered using hook and cautery apparatus. Once the gallbladder was free, the robot was undocked and  the camera moved to a lateral port. The gallbladder was captured in an Endo Catch apparatus and removed through the umbilical port. The area was copiously irrigated and suctioned. No significant abnormalities were identified. The abdomen was desufflated. Skin incisions were closed with 5-0 nylon. The area was infiltrated with 0.25% Marcaine for postoperative pain control. Sterile dressings were applied. The patient was returned to the recovery room having tolerated the procedure well. Sponge, instrument and needle counts were correct x2 in the operating room.    ____________________________ Micheline Maze, MD rle:OSi D: 03/31/2013 09:40:52 ET T: 03/31/2013 09:58:15 ET JOB#: [id number]  cc: Micheline Maze, MD, <Dictator> Leonie Douglas. Doy Hutching, MD Rodena Goldmann MD ELECTRONICALLY SIGNED 04/02/2013 10:44

## 2015-04-01 NOTE — Consult Note (Signed)
Brief Consult Note: Diagnosis: 1. Bell's Palsy 2. HTN 3. Hx of Bipolar disorder.   Patient was seen by consultant.   Consult note dictated.   Discussed with Attending MD.   Comments: 34 yo male w/ hx of HTN, Bipolar disorder who presented to the hospital w/ right sided facial weakness/numbness.  1. Bell's Palsy - likely cause of pt's facial weakness/numbess.  Unlikely acute CVa.  - will d/c on Prednisone taper and follow up w/ Dr. Doy Hutching next week.   2. HtN - cont. home meds.   3. bipolar disorder - cont. Lithium. Level normal.   d/c home today.   Job # [id number].  Electronic Signatures: Henreitta Leber (MD)  (Signed 30-Aug-15 18:39)  Authored: Brief Consult Note   Last Updated: 30-Aug-15 18:39 by Henreitta Leber (MD)

## 2015-04-01 NOTE — Discharge Summary (Signed)
PATIENT NAME:  David Wheeler, David Wheeler MR#:  [id number] DATE OF BIRTH:  02/04/1981  DATE OF ADMISSION:  08/18/2014 DATE OF DISCHARGE:  08/22/2014  REASON FOR ADMISSION: Headache.   HISTORY OF PRESENT ILLNESS: Please see the dictated HPI done by Dr. Lavetta Nielsen on 08/18/2014.   PAST MEDICAL HISTORY: 1.  Bipolar disorder.  2.  Recent Bell's palsy.  3.  Benign hypertension.   MEDICATIONS ON ADMISSION: Please see admission note.   ALLERGIES: CONTRAST DYE, SHELLFISH, AND LATEX.   SOCIAL HISTORY, FAMILY HISTORY, AND REVIEW OF SYSTEMS: As per admission note.   PHYSICAL EXAMINATION: GENERAL: The patient was in no acute distress.  VITAL SIGNS: Stable and he was afebrile.  HEENT: Unremarkable.  NECK: Supple without JVD.  LUNGS: Clear.  CARDIAC: Regular rate and rhythm. Normal S1, S2.  ABDOMEN: Soft and nontender.  EXTREMITIES: Without edema.  NEUROLOGIC: Exam was grossly nonfocal other than right facial Bell's palsy noted.   HOSPITAL COURSE: The patient was admitted with Bell's palsy and severe headache. He was admitted with medical therapy for pain control. An MRI of the brain was ordered, as well as a venogram which were both negative. His sedimentation rate was normal. He was seen by neurology. LP was performed, which was nondiagnostic. He was felt to have viral encephalitis. He was seen by infectious disease who recommended empiric viral therapy given the patient's Bell's palsy. The patient's symptoms improved. His headache resolved. He was stable and ready for discharge by 08/22/2014.   DISCHARGE DIAGNOSES: 1.  Intractable headache.  2.  Viral encephalitis.  3.  Bell's palsy.  4.  Benign hypertension.  5.  Bipolar disorder.   DISCHARGE MEDICATIONS: 1.  Ziac 5/6.25 one p.o. daily.  2.  Lithium 600 mg p.o. b.i.d.  3.  Fioricet 2 tablets every 6 hours as needed for headache.  4.  Acyclovir 800 mg p.o. t.i.d. x 1 week.  5.  Norflex 100 mg p.o. b.i.d. p.r.n.   FOLLOWUP PLANS AND APPOINTMENTS:  The patient was discharged on a low sodium diet. He will follow up with me in 1 to 2 weeks, sooner if needed.    ____________________________ Leonie Douglas Doy Hutching, MD jds:at D: 08/27/2014 10:41:26 ET T: 08/27/2014 13:59:09 ET JOB#: [id number]  cc: Leonie Douglas. Doy Hutching, MD, <Dictator> Eames Dibiasio Lennice Sites MD ELECTRONICALLY SIGNED 08/27/2014 16:41

## 2015-04-01 NOTE — Consult Note (Signed)
Brief Consult Note: Diagnosis: Bipolar disorder depressed.   Patient was seen by consultant.   Consult note dictated.   Recommend further assessment or treatment.   Orders entered.   Comments: [name] has a h/o bipolar . He was brought to ER for suicidal threats in the context of medication noncompliance and marital conflict.  PLAN: 1. The patient no lopnger meets criteria for IVC. I will terminate proceeding. Please discharge as appropriate.  2. He is to continue Lithium 600 mg daily. Rx provided.   3. He will follow up with [facility] on 04/01/14  4. Marital therapy with [facility].  5. Wife will pick him up..  Electronic Signatures: Orson Slick (MD)  (Signed 06-Apr-15 13:26)  Authored: Brief Consult Note   Last Updated: 06-Apr-15 13:26 by Orson Slick (MD)

## 2015-04-01 NOTE — Consult Note (Signed)
PATIENT NAME:  David Wheeler MR#:  [id number] DATE OF BIRTH:  07-14-1981  DATE OF CONSULTATION:  08/07/2014  REFERRING PHYSICIAN:   CONSULTING PHYSICIAN:  Libni Fusaro J. Verdell Carmine, MD  PRIMARY CARE PHYSICIAN: Fulton Reek, MD  CHIEF COMPLAINT: Right-sided facial numbness and weakness.   HISTORY OF PRESENT ILLNESS: This is a 34 year old male who presents to the hospital with acute right facial numbness and weakness that began earlier this morning. The patient says he was in his usual state of health when he woke up with the symptoms as mentioned above. The patient does say that he was having some inner ear pain, thought he had an inner ear infection. He went to the water park with his family today and felt a little bit lightheaded and dizzy, which resolved fairly quickly. He does admit to a headache which he still has, but denies any right upper extremity weakness, numbness, or any other further associated neurologic symptoms. Hospitalist services were contacted for further treatment and evaluation.   REVIEW OF SYSTEMS: CONSTITUTIONAL: No documented fever. No weight gain. No weight loss.  EYES: No blurred or double vision.  ENT: No tinnitus. No postnasal drip. No redness of the oropharynx.  RESPIRATORY: No cough, no wheeze, no hemoptysis, no dyspnea.  CARDIOVASCULAR: No chest pain, no orthopnea, no palpitation, no syncope.  GASTROINTESTINAL: No nausea, no vomiting, diarrhea. No abdominal pain. No melena or hematochezia.  GENITOURINARY: No dysuria or hematuria.  ENDOCRINE: No polyuria or nocturia, heat or cold intolerance.  HEMATOLOGIC: No anemia, no bruising, no bleeding.  INTEGUMENTARY: No rashes, no lesions.  MUSCULOSKELETAL: No arthritis, no swelling. No gout.  NEUROLOGIC: Positive numbness and tingling of the right face. No other focal neurologic abnormalities noted. No ataxia. No seizure-type activity.  PSYCHIATRIC: No anxiety, no insomnia. No ADD. Positive bipolar disorder.   PAST MEDICAL  HISTORY: Consistent with hypertension and bipolar disorder.   ALLERGIES: SHELL FISH, RADIOACTIVE DYE AND LATEX.   SOCIAL HISTORY: Used to smoke, quit a few years back, does have a 15 pack-year smoking history. Occasional alcohol use. No illicit drug abuse.   FAMILY HISTORY: Cannot recall as he was adopted.   CURRENT MEDICATIONS: He is currently on lithium 300 mg 2 capsules b.i.d., bisoprolol hydrochlorothiazide 5/6.25 one tablet daily.   PHYSICAL EXAMINATION: Presently is as follows:  VITAL SIGNS: Temperature: The patient is afebrile. Pulse 82, respirations 20, blood pressure 135/92, saturation is 99% on room air.  GENERAL: He is a pleasant-appearing male, no apparent distress.  HEENT: He is atraumatic, normocephalic. Extraocular muscles are intact. Pupils equal and reactive to light. Sclerae anicteric. No conjunctival injection. No pharyngeal erythema.  NECK: Supple. There is no jugular venous distention. No bruits, no lymphadenopathy, no thyromegaly.  HEART: Regular rate and rhythm. No murmurs, no rubs, no clicks.  LUNGS: Clear to auscultation bilaterally. No rales, rhonchi, no wheezes.  ABDOMEN: Soft, flat, nontender, nondistended. He has good bowel sounds. No hepatosplenomegaly appreciated.  EXTREMITIES: No evidence of any cyanosis, clubbing or peripheral edema. He has +2 pedal and radial pulses bilaterally.  NEUROLOGICAL: The patient is alert, awake and oriented x 3. He does have a right-sided facial weakness and numbness. No focal upper extremity or lower extremity weakness bilaterally. No numbness or tingling on the extremities bilaterally. Reflexes +2. Babinski's downgoing bilaterally. SKIN: Moist and warm with no rashes.  LYMPHATIC: There is no cervical or axillary lymphadenopathy.   LABORATORY DATA: Serum glucose of 113, BUN 9, creatinine 1.3, sodium 142, potassium 3.6, chloride 110, bicarbonate 23. The  patient's LFTs are within normal limits. Troponin less than 0.02. Lithium level  is 0.42. White cell count 7.4, hemoglobin 14.5, hematocrit 43.4, platelet count 194,000. The patient did have a CT of the head done without contrast which showed no evidence of acute intracranial abnormality.   ASSESSMENT AND PLAN: This is a 34 year old male with history of hypertension, bipolar disorder who presents to the hospital with right-sided facial weakness and numbness.  1.  Bell palsy. I presume this is the likely cause of the patient's facial weakness and numbness. He has no other upper extremity or lower extremity weakness or numbness. My suspicion for cerebrovascular accident is very low. The patient possibly may have had an inner ear infection, but his exam is clinically normal. For now, we will discharge him on a prednisone taper, have him follow up with his primary care physician, Dr. Doy Hutching. I also advised the patient if he developed any further neurologic symptoms, like any upper extremity or lower extremity weakness or numbness, to report back to the Emergency Room.  2.  Hypertension. The patient is hemodynamically stable. He will continue his bisoprolol hydrochlorothiazide. 3.  Bipolar disorder. He will continue his lithium. His level is normal.   DISPOSITION: The patient is going to be discharged home. He is in agreement with this plan.   TIME SPENT ON CONSULT: Fifty minutes.    ____________________________ Belia Heman. Verdell Carmine, MD vjs:TT D: 08/07/2014 18:39:38 ET T: 08/07/2014 20:11:55 ET JOB#: [id number]  cc: Belia Heman. Verdell Carmine, MD, <Dictator> Henreitta Leber MD ELECTRONICALLY SIGNED 08/17/2014 15:47

## 2015-04-01 NOTE — Consult Note (Signed)
PATIENT NAME:  David Wheeler, David Wheeler MR#:  [id number] DATE OF BIRTH:  [birth year]  DATE OF CONSULTATION:  03/14/2014  REFERRING PHYSICIAN:  Gretchen Wheeler. Beather Arbour, MD CONSULTING PHYSICIAN:  David Dant B. Lailyn Appelbaum, MD  REASON FOR CONSULTATION: To evaluate a suicidal patient.   IDENTIFYING DATA: [name] is a 34 year old male with history of bipolar disorder.   CHIEF COMPLAINT: "I had an argument with my wife."   HISTORY OF PRESENT ILLNESS: [name] has had a history of bipolar. He was hospitalized twice at [facility]. He has been maintained on lithium. This is prescribed by Dr. Doy Wheeler as the patient no longer sees a psychiatrist. He has been doing well; however, he does admit that he takes lithium inconsistently and, indeed, his lithium level is subtherapeutic. On the day of admission, he reportedly was arguing with his wife.  He took a knife to the bathroom, and closed the door. Reportedly, he was cleaning his nails.  The wife was frightened and called police. By the time police arrived, the patient jumped in his truck and left. He was picked up by police and brought to the Emergency Room. He denies thoughts of hurting himself or others but does admit that some of his behaviors and statements could have made his wife worried. He reports that while on the lithium he is doing well. He has difficulty taking medication twice a day, and the past weekend, he did not take it at all. He tells me that Dr. Doy Wheeler did check his lithium level a year or so ago and it was in the low therapeutic range. The patient has been taking the same dose 600 mg of lithium for many years now. He does not like any other medications as they make him feel strange. He denies any symptoms of depression, anxiety, or psychosis. He denies mood instability. He does admit that recently he is under considerable stress and has not been able to handle the stress very well. He believes that he needs to start lithium regularly and with  his wife go to counseling. He denies alcohol or substance use. He is employed and is now worried that involuntary commitment would put him in trouble with his employer.  He reports some symptoms suggestive of bipolar hypomania in the past but not since he has been put on lithium in 2013.   PAST PSYCHIATRIC HISTORY: He was hospitalized at least 3 times in 2009 at [facility] for depression; in 2013, twice at [facility]. He has a history of overdose on Vicodin, hydrocodone, and also cutting in the past. He denies any current cutting. He has been tried on Seroquel, Lamictal, and Zoloft but did not like any of the medicines. He likes lithium alright. He does not see a psychiatrist as he cannot identify anyone who takes his insurance. So far, Dr. Doy Wheeler was kindly prescribing lithium. The patient does have a history of alcoholism in his 22s but has not been drinking in years and denies any prescription pills or illicit substance use.   FAMILY PSYCHIATRIC HISTORY: He is adopted.   PAST MEDICAL HISTORY: Obesity, hypertension, sleep apnea.   MEDICATION ON ADMISSION: Lithium 300 mg twice daily.   ALLERGIES:  IODINATED RADIOCONTRAST DYE, SHELLFISH, LATEX.   SOCIAL HISTORY: The patient was born in [place] and adopted at birth. He moved to [facility] when he was 12. No history of physical or sexual abuse. 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  Works as a Audiological scientist.  He is married  for the second time. There is conflict in this relationship. His wife works at [employer]. There are no legal troubles.   REVIEW OF SYSTEMS: CONSTITUTIONAL: No fevers or chills. No weight changes.  EYES: No double or blurred vision.  EARS, NOSE, AND THROAT: No hearing loss.   RESPIRATORY: No shortness of breath or cough.  CARDIOVASCULAR: No chest pain or orthopnea. GASTROINTESTINAL: No abdominal pain, nausea, vomiting, or diarrhea.  GENITOURINARY: No incontinence or frequency.   ENDOCRINE: No heat or cold intolerance.  LYMPHATIC: No anemia or easy bruising.  INTEGUMENTARY: No acne or rash.  MUSCULOSKELETAL: No muscle or joint pain.  NEUROLOGIC: No tingling or weakness.  PSYCHIATRIC: See history of present illness for details.   PHYSICAL EXAMINATION:  VITAL SIGNS: Blood pressure 132/80, pulse 80, respirations 18, temperature 98.  GENERAL: This is a well-developed male in no acute distress. The rest of the physical examination is deferred to his primary attending.  LABORATORY DATA: Chemistries are within normal limits with blood sugar of 107, creatinine 1.49. Blood alcohol level is zero. LFTs within normal limits. TSH 3.84. Lithium less than 0.2.  Urine toxicology screen negative for substances. CBC within normal limits. Serum acetaminophen and salicylates are low.   MENTAL STATUS EXAMINATION: The patient is alert and oriented to person, place, time, and situation. He is pleasant, polite, and cooperative. He is well groomed, wearing hospital scrubs and a yellow shirt. He maintains good eye contact. His speech is of normal rhythm, rate, and volume, rather soft. Mood is fine with full affect. Thought process is logical and goal oriented. Thought content: He denies suicidal or homicidal ideation, but reportedly, the night before was threatening to hurt himself with a knife. Told his wife that this is the last time she sees him and posted some information on his Facebook that we were unable to access. He denies delusions or paranoia. He denies auditory or visual hallucinations. His cognition is grossly intact. He registers 3/3 and recalls 3/3 objects after 5 minutes. He can spell "world" forwards and backwards. He knows the current president. He is a good historian. He is of normal intelligence and fund of knowledge. His insight and judgment are questionable.   DIAGNOSES:  AXIS I:  1. Bipolar affective disorder.  2. History of alcohol dependence, in full remission.  AXIS II:  Deferred.  AXIS III: Sleep apnea, hypertension.  AXIS IV: Marital conflict.  AXIS V: Global assessment of functioning of 60.   PLAN:   1. The patient no longer meets criteria for an involuntary inpatient psychiatric commitment. I will terminate proceedings. Please discharge as appropriate.  2. The patient is to continue lithium. He can take it as a single dose 600 mg daily, prescription was provided.  3. The patient wants to follow up with Dr. Doy Wheeler.   Dr. Doy Wheeler opposes. We made an appointment for him with CBC. 4. The wife will pick him up.     ____________________________ Wardell Honour. Kaelynne Christley, MD jbp:dd D: 03/14/2014 17:19:00 ET T: 03/14/2014 19:03:50 ET JOB#: [id number]  cc: Bryndle Corredor B. Bary Leriche, MD, <Dictator> Clovis Fredrickson MD ELECTRONICALLY SIGNED 03/14/2014 23:09

## 2015-04-01 NOTE — Consult Note (Signed)
Referring Physician:  Lytle Butte :   Primary Care Physician:  Darol Destine, [address], [place], [place] [address], [place] [phone]  Reason for Consult: Admit Date: 18-Aug-2014  Chief Complaint: headache  Reason for Consult: headache   History of Present Illness: History of Present Illness:   34 yo RHD M presents to [facility] secondary to intractable headache over the past 3-4 days.  Pt reports developing Bells palsy about 2 weeks ago which has not gotten better and then he developed headache.  Headache is mainly in the R neck and stabbing like with radiation to back of head and shoulders.  No photophobia, N/V or phonophobia reported.  Pt does not tearing of his eyes or runny nose either.  Pt denies any diplopia, numbness/tingling or weakness.  No memory issues described either.  ROS:  General denies complaints   HEENT no complaints   Lungs no complaints   Cardiac no complaints   GI no complaints   GU no complaints   Musculoskeletal no complaints   Extremities no complaints   Skin no complaints   Neuro headache   Endocrine no complaints   Psych no complaints   Past Medical/Surgical Hx:  GERD - Esophageal Reflux:   Gastritis:   HTN:   Arthritis:   Kidney Stones:   Melanoma:   Lung nodule:   H/O substance/ETOH abuse:   Depression:   Migraines:   Hx of OD 2008:   Bipolar Type II:   Sleep Apnea - uses CPap @ bedtime:   Pericarditis - 2012:   Colonoscopy/EGD:   Tonsillectomy:   Past Medical/ Surgical Hx:  Past Medical History reviewed by me as above   Past Surgical History reviewed by me as above   Home Medications: Medication Instructions Last Modified Date/Time  methocarbamol 750 mg oral tablet  orally 2 times a day 10-Sep-15 04:16  predniSONE  orally  5 mg, (1 tablet)(last dose from a taper off)  10-Sep-15 04:16  lithium 300 mg oral capsule 2 caps ($Remov'600mg'UmIOwI$ ) orally 2 times a day 10-Sep-15 04:16  bisoprolol-hydrochlorothiazide 5  mg-6.25 mg oral tablet 1 tab(s) orally once a day 10-Sep-15 04:16   Allergies:  Iodinated Radiocontrast Dyes: Anaphylaxis  Shellfish: Anaphylaxis  Latex: Itching  Allergies:  Allergies NKDA   Social/Family History: Employment Status: currently employed  Lives With: spouse  Living Arrangements: house  Social History: no tob, no EtOH, no illicits  Family History: no stroke, no seizures   Vital Signs: **Vital Signs.:   10-Sep-15 13:29  Vital Signs Type Routine  Temperature Temperature (F) 97.6  Celsius 36.4  Temperature Source oral  Pulse Pulse 63  Respirations Respirations 20  Systolic BP Systolic BP 233  Diastolic BP (mmHg) Diastolic BP (mmHg) 86  Mean BP 99  Pulse Ox % Pulse Ox % 95  Pulse Ox Activity Level  At rest  Oxygen Delivery Room Air/ 21 %   Physical Exam: General: overweight, mild acute distress  HEENT: normocephalic, sclera nonicteric, oropharynx clear  Neck: supple, no JVD, no bruits, neg Brudinski and Kernig  Chest: CTA B, no wheezing, good movement  Cardiac: RRR, no murmurs, no edema, 2+ pulses  Extremities: no C/C/E, FROM   Neurologic Exam: Mental Status: alert and oriented x 3, normal speech and language, follows complex commands  Cranial Nerves: PERRLA, EOMI, nl VF except R CN VII palsy, there is also a CN V palsy noted as well, face symmetric, tongue midline, shoulder shrug equal  Motor Exam: 5/5  B normal, tone, no tremor  Deep Tendon Reflexes: 2+/4 B, plantars downgoing B, no Hoffman  Sensory Exam: pinprick, temperature, and vibration intact B  Coordination: FTN and HTS WNL, nl RAM, nl gait   Lab Results: TDMs:  09-Sep-15 23:24   Lithium, Serum  0.59 (0.60-1.20 TOXIC >= 1.50 mmol/L)  Routine Chem:  09-Sep-15 23:24   Glucose, Serum  122  BUN  21  Creatinine (comp) 1.25  Sodium, Serum 138  Potassium, Serum 3.7  Chloride, Serum 105  CO2, Serum 27  Calcium (Total), Serum 8.8  Anion Gap  6  Osmolality (calc) 280  eGFR (African American)  >60  eGFR (Non-African American) >60 (eGFR values <90m/min/1.73 m2 may be an indication of chronic kidney disease (CKD). Calculated eGFR is useful in patients with stable renal function. The eGFR calculation will not be reliable in acutely ill patients when serum creatinine is changing rapidly. It is not useful in  patients on dialysis. The eGFR calculation may not be applicable to patients at the low and high extremes of body sizes, pregnant women, and vegetarians.)  Ethanol, S. < 3 (Result(s) reported on 17 Aug 2014 at 11:54PM.)  Urine Drugs:  [id number]  Tricyclic Antidepressant, Ur Qual (comp) NEGATIVE (Result(s) reported on 18 Aug 2014 at 03:55AM.)  Amphetamines, Urine Qual. NEGATIVE  MDMA, Urine Qual. NEGATIVE  Cocaine Metabolite, Urine Qual. NEGATIVE  Opiate, Urine qual NEGATIVE  Phencyclidine, Urine Qual. NEGATIVE  Cannabinoid, Urine Qual. NEGATIVE  Barbiturates, Urine Qual. NEGATIVE  Benzodiazepine, Urine Qual. NEGATIVE (----------------- The URINE DRUG SCREEN provides only a preliminary, unconfirmed analytical test result and should not be used for non-medical  purposes.  Clinical consideration and professional judgment should be  applied to any positive drug screen result due to possible interfering substances.  A more specific alternate chemical method must be used in order to obtain a confirmed analytical result.  Gas chromatography/mass spectrometry (GC/MS) is the preferred confirmatory method.)  Methadone, Urine Qual. NEGATIVE  Routine Hem:  09-Sep-15 23:24   WBC (CBC)  11.0  RBC (CBC) 5.41  Hemoglobin (CBC) 14.7  Hematocrit (CBC) 44.9  Platelet Count (CBC) 190 (Result(s) reported on 17 Aug 2014 at 11:47PM.)  MCV 83  MCH 27.2  MCHC 32.8  RDW 13.9  10-Sep-15 07:51   Erythrocyte Sed Rate 1 (Result(s) reported on 18 Aug 2014 at 08:30AM.)   Radiology Impression: Radiology Impression: MRI of brain personally reviewed by me and is normal    Impression/Recommendations: Recommendations:   prior notes reviewed by me reviewed by me   Headache-  no clear etiology as of now but this is concerning for possible CNS infection with multiple cranial nerve problems such as CN V and VII palsy.   CN V palsy-  pt could have Bells but this is concerning with sensation changes along the same side LP tomorrow once coagulation studies return start Acyclovir 140mkg IV q8h start Rocephin 1gm q12h IV Toradol 154m6h, magnesium 500m77mh and reglan 10mg21m (all scheduled for headache) PRN fioricet ok will follow  Electronic Signatures: SmithJamison Neighbor  (Signed 10-Sep-15 15:16)  Authored: REFERRING PHYSICIAN, Primary Care Physician, Consult, History of Present Illness, Review of Systems, PAST MEDICAL/SURGICAL HISTORY, HOME MEDICATIONS, ALLERGIES, Social/Family History, NURSING VITAL SIGNS, Physical Exam-, LAB RESULTS, RADIOLOGY RESULTS, Recommendations   Last Updated: 10-Sep-15 15:16 by SmithJamison Neighbor

## 2015-04-01 NOTE — H&P (Signed)
PATIENT NAME:  David Wheeler, SEIPP MR#:  [id number] DATE OF BIRTH:  [birth year]  DATE OF ADMISSION:  08/18/2014  REFERRING PHYSICIAN: Dr. Lurline Hare.   PRIMARY CARE PHYSICIAN: Dr. Fulton Reek at [facility].  CHIEF COMPLAINT: Headache.   HISTORY OF PRESENT ILLNESS: This is a 34 year old Caucasian gentleman with history of migraines, bipolar disorder type II, hypertension, as well as recent diagnosis of Bell's palsy, presenting with headache. Of note, recently completed two separate courses of steroids for Bell's palsy. However, his symptoms remain right side of her face as well as hyperacusis. He is presenting with headache located over the right-sided occipital portion of his head of 2 day duration. Describes quality as sharp. Intensity 9 to 10 out of 10, worsened with some radiation towards the parietal region in the right side, worsened by bright lights as well as loud noises.  States this is more severe than prior migraines. Has no fevers, chills, or further symptomatology.  Described his pain, still to 9 out of 10 despite sitting comfortably in bed.   REVIEW OF SYSTEMS:  CONSTITUTIONAL: Denies fevers, chills, fatigue.  EYES: Denies blurred vision, double vision, eye pain. EARS, NOSE AND THROAT: Denies tinnitus, ear pain or hearing loss.  RESPIRATORY: Denies cough, wheeze, or shortness of breath.  CARDIOVASCULAR: Denies chest pain, palpitations, edema.  GASTROINTESTINAL: Denies nausea, vomiting, diarrhea or abdominal pain.  GENITOURINARY: No dysuria or hematuria.  ENDOCRINE: Denies nocturia or thyroid problems.  HEMATOLOGIC AND LYMPHATIC: Denies easy bruising, bleeding.  SKIN: Denies rash or lesion.  MUSCULOSKELETAL: Denies pain in neck, back, shoulder, knees, hips or arthritic symptoms.  NEUROLOGIC: Positive for headache as described above; however, denies any paralysis, paresthesias.  PSYCHIATRIC: Denies anxiety or depressive symptoms.   Otherwise, full review of systems performed by  me is negative.   PAST MEDICAL HISTORY: Hypertension, bipolar disorder type 2 as well as recent diagnosis of Bell's palsy.   SOCIAL HISTORY: Remote tobacco usage. Occasional alcohol usage. No drug usage.   FAMILY HISTORY: States that he was adopted.   ALLERGIES: CONTRAST DYE, SHELLFISH AND LATEX.   HOME MEDICATIONS: Include bisoprolol/hydrochlorothiazide 5/6.25 mg p.o. daily, lithium 300 mg 2 capsules p.o. b.i.d.   PHYSICAL EXAMINATION:  VITAL SIGNS: Temperature 97.9, heart rate 71, respirations 18, blood pressure 160/95, saturating 96% on room air. Weight 136.1 kg, BMI 37.5.  GENERAL: Well-nourished, well-developed, Caucasian gentleman, currently in no acute distress.  HEAD: Normocephalic, atraumatic.  EYES: Pupils equal, round, reactive to light. Extraocular muscles intact. No scleral icterus.  MOUTH: Moist oral mucosa. Dentition intact. No abscess noted.  EARS, NOSE AND THROAT: Clear without exudates. No external lesions.  NECK: Supple. No thyromegaly. No nodules. No JVD.  PULMONARY: Clear to auscultation bilaterally without wheezes, rales, or rhonchi. Has good musculature. Nontender on  palpation.  CARDIOVASCULAR: S1, S2, regular rate and rhythm. No murmurs, rubs, or gallops. No edema. Pedal pulses 2+ bilaterally.  ABDOMEN: Soft, nontender.  Nondistended.  No masses. Positive bowel sounds. No hepatosplenomegaly.  MUSCULOSKELETAL: No swelling, clubbing or edema.  Full range of motion in all extremities.  NEUROLOGIC: The right side of face droop consistent with recent diagnosis of Bell's palsy. Otherwise, aside from the above finding, otherwise cranial nerves II through XII intact. No gross focal neurologic deficits. As stated above. Strength 5/5 in all extremities including proximal and distal flexion and extension. Pronator drift within normal limits. Sensation intact. Reflexes intact.  SKIN: No ulceration, lesions, rash, cyanosis. Skin warm, dry. Turgor intact.  PSYCHIATRIC: Mood and  affect  within normal limits. Alert and oriented  x 3. Insight and judgment intact.   LABORATORY DATA: Sodium 138, potassium 2.7, chloride 105, bicarbonate 27, BUN 21, creatinine 1.25, glucose 122, lithium 0.59. Urine drug screen negative. WBC 11, hemoglobin 14.7, platelets of 190,000.   ASSESSMENT AND PLAN: A 34 year old gentleman with history of migraines, bipolar disorder type II, hypertension recently diagnosed with Bell's palsy presenting with intractable headache.  1. Intractable headache. He has been given Valium, Dilaudid, Ketoralac, Reglan, Compazine in the Emergency Department without any relief. Still states the pain is 9 out of 10. Will give a dose of sumatriptan now and redose in 12 hours if still present.  2. Hypertension: Continue home medication bisoprolol/hydrochlorothiazide.  3. Bipolar disorder type II. Continue with lithium. 4. Venous thromboembolism prophylaxis, heparin subcutaneously.    TIME SPENT:  45 minutes.    ____________________________ Aaron Mose. Lyzette Reinhardt, MD dkh:JT D: 08/18/2014 03:51:58 ET T: 08/18/2014 04:10:15 ET JOB#: [id number]  cc: Aaron Mose. Yuji Walth, MD, <Dictator> Evon Dejarnett Woodfin Ganja MD ELECTRONICALLY SIGNED 08/19/2014 2:02

## 2015-04-01 NOTE — Consult Note (Signed)
PATIENT NAME:  David Wheeler, David Wheeler MR#:  [id number] DATE OF BIRTH:  July 02, 1981  DATE OF CONSULTATION:  08/22/2014  REFERRING PHYSICIAN:   CONSULTING PHYSICIAN:  Cheral Marker. Ola Spurr, MD  REQUESTING PHYSICIAN: Dr. Fulton Reek.   REASON FOR CONSULTATION: Headaches and Bell palsy.   HISTORY OF PRESENT ILLNESS: This is a very pleasant 34 year old gentleman with history of obesity as well as occasional headaches, who was admitted September 10th with intractable headache. He reports that his symptoms initially began with ear pain around August 28th. On August 29th he went to be water park with his children. Throughout that day he developed increasing headache and then developed Bell palsy with right-sided facial weakness. He was seen in the Emergency Room August 30th, had a negative CT scan of the head as well as normal comprehensive panel and a white count of 7.4. He then developed much worse headache and came to the ER for pain control. He was admitted. He was having 10 out of 10 pain. He was seen by neurology and underwent MRI, MRV, and lumbar puncture, were relatively unrevealing. He has been treated now IV antibiotics as well as IV acyclovir and pain control. Clinically his headache is much improved, but his right-sided facial palsy persists. His ear pain is much improved.   Of note, he has not had any recent travel, animal exposure except for a pet dog, no other exposures. He has not traveled outside of the state in the last several months. He is married to [place] who is a respiratory therapist in our clinic. He has not had high spiking fevers, weight loss, chills, or night sweats. He has not noted any rash, especially no rash around his ear. He has also not had any sore throat, pharyngitis, mucosal membrane ulcers, or lymphadenopathy.   PAST MEDICAL HISTORY:  1. Obesity.  2. Bipolar type 2.  3. Hypertension.  4. Prior history of migraine.   PAST SURGICAL HISTORY: None.   SOCIAL HISTORY: Prior  tobacco use. Occasional alcohol use. No drug use, multiple toxicology screens negative in the past. He is married. Has several children. Works as a Audiological scientist.   FAMILY HISTORY: He is adopted.   ALLERGIES: DYE, SHELLFISH, AND LATEX.    MEDICATIONS: Antibiotics since admission include IV acyclovir from September 9th until. September 13th, ceftriaxone September 9th through 11th, he also is on lithium, magnesium, and he has been on p.r.n. Reglan, diazepam, bisoprolol, hydrochlorothiazide.   REVIEW OF SYSTEMS : 11 systems reviewed and negative except as per HPI.    PHYSICAL EXAMINATION:  VITAL SIGNS: Temperature 97.7, pulse 78, blood pressure 121/88, respirations 20, sat 96% on room air. He has been afebrile throughout his admission.  GENERAL: He is overweight, in no acute distress, lying in bed.  HEENT: Pupils are equal, round, and reactive to light and accommodation. Extraocular movements are intact. He does have a dense right-sided facial paralysis. Hearing is intact. His tympanic membranes are normal bilaterally. On his right ear he does not have any obvious vesicles or lesions. He does have a small area of what appears to be an abrasion which could also be a prior denuded very small ulcer.  NECK: Supple. Anterior cervical, posterior cervical or supraclavicular lymphadenopathy is absent.  HEART:  Regular. LUNGS: Clear.  ABDOMEN: Soft, obese, nontender, nondistended.  EXTREMITIES: No clubbing, cyanosis, or edema.  SKIN: No dermatological lesions.  NEUROLOGIC: He is alert and oriented x 3. He has a cranial nerve 7 deficit on the right, but otherwise neurologically intact.  DATA: White blood count on admission September 9th was 11.0, hemoglobin 14.7, platelets 190,000. Sedimentation rate was 1. INR was 1.0. Cerebrospinal fluid showed 83 reds, 5 whites, 70% lymphocytes, 26% macrophages, 5% neutrophils, protein  level was 49 with normal being less than 45, glucose was normal. Culture was negative.  HSV-1 and 2 PCR on the CSF were negative. MRI scan was done and was normal. MRV was also normal.   IMPRESSION: A 34 year old gentleman admitted with right-sided Bell palsy, right ear pain, and severe headache. Workup so far has been unrevealing except for slight elevation in his CSF protein and white counts of 5 in his lumbar fluid. HSV has been negative. MRI, CT all negative.   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 This should be covered by the acyclovir or Valtrex. I have asked the laboratory to add on VZV PCR to his CSF. There would be no major change in his management with this diagnosis, so  I think he is safe for discharge since he is feeling much better. I discussed this with the patient and his wife. We can follow up on the results as an outpatient.   Thank you for the consult. I will be glad to follow with you.     ____________________________ Cheral Marker. Ola Spurr, MD dpf:bu D: 08/22/2014 15:43:48 ET T: 08/22/2014 17:22:11 ET JOB#: [id number]  cc: Cheral Marker. Ola Spurr, MD, <Dictator> [place] Voytko Ola Spurr MD ELECTRONICALLY SIGNED 09/11/2014 23:51

## 2015-04-02 NOTE — H&P (Signed)
PATIENT NAME:  David Wheeler, David Wheeler MR#:  [id number] DATE OF BIRTH:  [birth year]  DATE OF ADMISSION:  03/04/2012  REFERRING PHYSICIAN: Beaulah Dinning, MD  ADMITTING PHYSICIAN: Cephus Shelling, MD   IDENTIFYING INFORMATION: David Wheeler is married 34 year old Caucasian male with a history of recurrent depression who lives with his wife and two children in the [place] area. He does work as a Paramedic.   HISTORY OF PRESENT ILLNESS: David Wheeler is a 34 year old married Caucasian male with history of recurrent depression who presented to the emergency room after referral from Dr. Doy Hutching secondary to the patient admitting to Dr. Doy Hutching that he was having suicidal thoughts two days ago and has had no improvement on the Lamictal that was started by Dr. Doy Hutching approximately one month ago. The patient says that after an argument with his wife he texted her and stated that he would stop his medications and do something stupid. He did endorse that he has been feeling suicidal over the past 1-1/2 months and that the thoughts are constant and frequent. He denies any specific plan or intent. The patient does endorse a number of recent stressors including financial problems from last year when he was still in paramedic school in addition to some marital conflict. The patient has one biological child from his first marriage and one stepchild from his second marriage. The patient says that he and his wife are both having problems with their ex-spouses. In addition, their children are having problems adjusting to the ex-spouses remarrying. The patient says in particular his son's mother is now converting to being a Mormon and is trying to convert the son as well. In addition, last year his grandfather passed away. He does endorse problems with focus and concentration, decreased energy level, insomnia, and problems with increased appetite and weight gain. The patient is currently weighs 300 pounds and has had a 10 to 12 pound weight  gain in the past two months. He has been very unhappy with his weight. There are no episodes of binging or purging or periods of starvation. The patient denies any homicidal thoughts or psychotic symptoms including auditory or visual hallucinations, no paranoid thoughts or delusions. He does endorse a history of some hypomanic symptoms including decreased sleep for several days at a time, difficulty with increased energy level and racing thoughts, and some reckless driving. He denies any hyperreligious thoughts, grandiose delusions, or psychotic symptoms with hypomanic episodes. The patient denies any heavy alcohol or illicit drug use. He does have a history of two suicide attempts in the past and says that two months ago he  tried to cut his wrist. He does report a history of cutting.   PAST PSYCHIATRIC HISTORY: The patient was hospitalized at [facility] in 2009 for depression. He also reports history of an overdose on Vicodin and hydrocodone in 2008. He says that he did cut in the past in order to relieve stress and anger and his last time cutting was about two months ago. He saw a psychiatrist for about a six month period, in 2009, after being discharged from the hospital, but is currently now getting his psychotropic medications from Dr. Doy Hutching. The patient is on Lamictal 100 mg p.o. daily and has been on Lamictal for the past one month. Prior to that, he was on Seroquel but says that the Seroquel "knocked him out". In 2009, he was on Zoloft but did not feel like the Zoloft helped.   SUBSTANCE ABUSE HISTORY: The patient does report a history of  heavy alcohol use in his early 71s, but now says he only drinks rarely, once every few weeks. He denies any cocaine, cannabis, opiate, or stimulant use. He denies any tobacco use.   FAMILY PSYCHIATRIC HISTORY: The patient is adopted and does not know the mental health history of his family.   PAST MEDICAL HISTORY: 1. History of hypertension.  2. Obesity. 3. Sleep  apnea.  4. 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 5. He denies any history of any prior seizures.   OUTPATIENT MEDICATIONS:  1. Lamictal 100 mg p.o. daily for the past one month. 2. Aleve p.r.n. for headaches.   ALLERGIES: Latex and seafood.   SOCIAL HISTORY: The patient was born in [place] and adopted at birth. He says his adoptive parents moved to the [place] area when he was 34 years old. He denies any history of any physical or sexual abuse. He graduated high school and also from the [employer]. He currently works full time as a Audiological scientist. In the past, he worked Chiropractor jobs, at the [employer], and as a Automotive engineer for summer camp. He is currently married to his second wife. He says he divorced his first wife as she was having difficulty dealing with the passing of her mother and that caused some marital issues. He has been married to his second wife for the past one year, but has been with her for the past three years. His wife has a 107-year-old son from a prior relationship, and he has a 75-year-old son from his first marriage. His wife works as a Statistician at [employer].   LEGAL HISTORY: He denies any history of any arrests or incarcerations.   MENTAL STATUS EXAMINATION: David Wheeler is a 34 year old obese Caucasian male who appeared appropriate for stated age. He was wearing a lime green shirt and burgundy scrub pants. He was fully alert and oriented to time, place, and situation. Speech was regular rate and rhythm, fluent, and coherent. Mood was depressed and affect was sad and depressed. Thought processes were linear, logical, and goal-directed. He denied any current active suicidal thoughts, but did endorse some passive suicidal thoughts. He denied any homicidal thoughts or psychotic symptoms including auditory or visual hallucinations. No paranoid thoughts or delusions. Attention and concentration were fairly good and recall was three out  of three initially and three out of three after 5 minutes. He could spell world backwards correctly and do serial sevens without difficulty. He could name the presidents backwards to [place]. Abstraction was good.   SUICIDE RISK ASSESSMENT: At this time, David Wheeler remains at a low to moderate risk of harm to self and others secondary to recent problems with suicidal thoughts as well as difficulty with hypomanic symptoms in the past. He has not had any improvement on Lamictal or Seroquel in the past. He is willing to come into the hospital voluntarily and is wanting to get help. He does report having two guns at home, one pistol shot and one shotgun. His wife was asked to lock these guns away so that the patient could not have access to them, and she verbally agreed.   REVIEW OF SYSTEMS: CONSTITUTIONAL: He denies any weakness, fatigue, or weight changes. He denies any fever, chills, or night sweats. HEAD: He does complain of a bad migraine every once in a while. He denies any diplopia or blurred vision. ENT: He denies any hearing loss. RESPIRATORY: He denies any shortness breath or cough.  CARDIOVASCULAR: He denies chest pain or orthopnea. GASTROINTESTINAL: He denies any nausea, vomiting, or abdominal pain. He denies any change in bowel movements. GENITOURINARY: He denies incontinence or problems with frequency of urine. ENDOCRINE: He denies any heat or cold intolerance. LYMPHATIC: He denies any anemia or easy bruising. MUSCULOSKELETAL: He denies any muscle or joint pain. NEUROLOGIC: He denies any tingling or weakness. PSYCHIATRIC: Please see history of present illness.   PHYSICAL EXAMINATION:   VITAL SIGNS: Blood pressure 169/101, heart rate 90, respirations 20, and temperature is not recorded.  HEENT: Normocephalic, atraumatic. Pupils equal, round, and reactive to light and accommodation. Extraocular movements intact. Oral mucosa was moist. No lesions noted.   NECK: Supple. No cervical lymphadenopathy or  thyromegaly present.   LUNGS: Clear to auscultation bilaterally. No crackles, rales, or rhonchi.   CARDIAC: S1 and S2 present, regular rate and rhythm. No murmurs, rubs, or gallops.   ABDOMEN: Soft and obese. Normoactive bowel sounds present in all four quadrants. No tenderness noted. No masses noted.   EXTREMITIES: +2 pedal pulses bilaterally. No rashes, cyanosis, clubbing, or edema.   NEUROLOGIC: Cranial nerves II through XII are grossly intact. Gait was normal and steady. +2 pedal pulses bilaterally. No hypo or hyperreflexia noted.   LABS/STUDIES: Sodium 141, potassium 3.6, chloride 106, CO2 25, BUN 12, creatinine 1.37, and glucose 106. LFTs within normal limits. CBC within normal limits. Acetaminophen and salicylate levels were unremarkable.   Urinalysis is pending. Urine drug screen is pending.   DIAGNOSES:   AXIS I:  1. Bipolar disorder type II. 2. History of alcohol abuse, in full remission.   AXIS II: Rule out borderline personality disorder.   AXIS III: Obesity, sleep apnea.   AXIS IV: Moderate - Marital conflict, difficulty with children and conflict with former spouses, death of grandfather last year.   AXIS V: GAF at present equals 30.   ASSESSMENT AND TREATMENT RECOMMENDATIONS: David Wheeler is a 34 year old married Caucasian male with a history of recurrent depression, per the patient, but also presenting with a history of hypomanic symptoms and irritability, most likely bipolar disorder type II. He was referred to the emergency room after having had worsening and more intrusive constant frequent suicidal thoughts. The patient will be admitted to inpatient psychiatry for medication management, safety, and stabilization. We will place on suicide precautions and close observation. We will place with a one-to-one sitter while in the emergency room.  1. Bipolar disorder type II and rule out borderline personality disorder: We will get MMPI to rule out personality disorder  secondary to history of cutting and constant suicidal thoughts. We will look at starting Depakote 500 mg p.o. daily and 1000 mg p.o. nightly for mood stabilization and Wellbutrin 100 mg p.o. twice a day for depression Wellbutrin may be helpful with curbing appetite and weight loss given the patient being so upset about recent weight gain. We will check B12 and folate level in the a.m. We will also get MMPI to rule out borderline personality disorder.  2. Obesity: We will place on a low-fat diet.  3. History of hypertension: We will monitor blood pressure and start antihypertensive, as recommended by Dr. Doy Hutching.  4. Sleep apnea: The patient can use his CPAP machine from home.  The risk, benefits, and alternatives to treatment were discussed with the patient and he consented to starting the Depakote and Wellbutrin. He also consented to hospitalization voluntarily. The case was also discussed with his wife.  TIME SPENT: 80 minutes ____________________________ Steva Colder. Nicolasa Ducking,  MD akk:[id number] D: 03/04/2012 18:98:42 ET T: 03/05/2012 06:54:49 ET JOB#: [id number]  cc: Aarti K. Nicolasa Ducking, MD, <Dictator> Chauncey Mann MD ELECTRONICALLY SIGNED 03/05/2012 11:02
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
## 2015-04-02 NOTE — Consult Note (Signed)
Past Medical/Surgical Hx:  Hx of OD 2008:   Bipolar Type II:   Sleep Apnea - uses CPap @ bedtime:   HTN without Tx:   Pericarditis - 2012:   Tonsillectomy:   Home Medications: Medication Instructions Last Modified Date/Time  Metoprolol Succinate ER 50 mg oral tablet, extended release 1 tab(s) orally once a day 15-Apr-13 01:39  divalproex sodium extended release 1250 milligram(s) orally once a day (at bedtime) 15-Apr-13 01:39  buPROPion 100 mg (SR) oral tablet, extended releas 1 tab(s) orally 2 times a day at 8:00 am and 3:00 pm 15-Apr-13 01:39   Allergies:  Shellfish: Anaphylaxis  Iodine: Unknown  Latex: Unknown  Vital Signs: **Vital Signs.:   16-Apr-13 07:25   Vital Signs Type Routine   Temperature Temperature (F) 98.1   Celsius 36.7   Pulse Pulse 75   Respirations Respirations 18   Systolic BP Systolic BP 366   Diastolic BP (mmHg) Diastolic BP (mmHg) 71   Systolic BP Systolic BP 440   Diastolic BP (mmHg) Diastolic BP (mmHg) 75   Lab Results: Hepatic:  14-Apr-13 12:32    Bilirubin, Total 0.5   Alkaline Phosphatase 53   SGPT (ALT) 21   SGOT (AST) 21   Total Protein, Serum 7.5   Albumin, Serum 4.4  TDMs:  14-Apr-13 12:31    Valproic Acid, Serum   46  Routine Chem:  14-Apr-13 12:31    Ethanol, S. < 3   Ethanol % (comp) < 0.003  16-Apr-13 06:14    Glucose, Serum 88   BUN 15   Creatinine (comp)   1.51   Sodium, Serum 141   Potassium, Serum 4.2   Chloride, Serum 105   CO2, Serum 27   Calcium (Total), Serum 8.7   Anion Gap 9   Osmolality (calc) 282   eGFR (African American) >60   eGFR (Non-African American) >60   Ammonia, Plasma 29  Urine Drugs:  34-VQQ-[id number] 56:38    Tricyclic Antidepressant, Ur Qual (comp) NEGATIVE   Amphetamines, Urine Qual. NEGATIVE   MDMA, Urine Qual. POSITIVE   Cocaine Metabolite, Urine Qual. NEGATIVE   Opiate, Urine qual NEGATIVE   Phencyclidine, Urine Qual. NEGATIVE   Cannabinoid, Urine Qual. NEGATIVE   Barbiturates, Urine Qual.  NEGATIVE   Benzodiazepine, Urine Qual. NEGATIVE   Methadone, Urine Qual. NEGATIVE  Routine UA:  14-Apr-13 00:11    Color (UA) Yellow   Clarity (UA) Hazy   Glucose (UA) Negative   Bilirubin (UA) Negative   Ketones (UA) Negative   Specific Gravity (UA) 1.027   Blood (UA) Negative   pH (UA) 6.0   Protein (UA) Negative   Nitrite (UA) Negative   Leukocyte Esterase (UA) Negative   RBC (UA) 1 /HPF   WBC (UA) 3 /HPF   Epithelial Cells (UA) 3 /HPF   Mucous (UA) PRESENT  Routine Hem:  14-Apr-13 12:32    WBC (CBC) 7.0   RBC (CBC) 5.33   Hemoglobin (CBC) 14.7   Hematocrit (CBC) 44.0   Platelet Count (CBC) 170   MCV 83   MCH 27.6   MCHC 33.5   RDW 13.9   Impression/Recommendations:  Recommendations:   Please see my dictation for details.Amnestic spell involving only declarative memory for an episode surrounding psychologically traumatic event, normal neurological exam.MRI brain non contrast as pt wants to make sure there is no organic reason for his rage episodes as well.Did counselling about memory formation, repression etc. Headache - no migraine here in hospital, usually once every month  can prescribe sumatriptan 50 mg po prn for migraine type headache. for the opportunity to participate in care of your patient.free to contact me with any further questions on this pt, I will follow this patient on PRN basis. Will review his MRI once available.  Electronic Signatures: Ray Church (MD)  (Signed 16-Apr-13 17:01)  Authored: PAST MEDICAL/SURGICAL HISTORY, HOME MEDICATIONS, ALLERGIES, NURSING VITAL SIGNS, LAB RESULTS, Recommendations   Last Updated: 16-Apr-13 17:01 by Ray Church (MD)
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
## 2015-04-02 NOTE — Consult Note (Signed)
Psychological Assessment  Ovid Curd Prince30of Evaluation: 3-28-13Administered: [place] (MMPI-2) for Referral: [name] was referred for a psychological assessment by his physician, Cephus Shelling, MD.  He was admitted to [facility] for treatment of recurrent depression with suicidal ideation. Please see the history and physical and psychosocial history for further background information. An assessment of personality structure was requested. Mr. Care?s MMPI-2 protocol is compared to that of other adult males he obtained the following profile: 127+(502) [id number]/:#. The MMPI-2 validity scales indicate that the clinical profile is valid. They also suggest he has adjusted to the experience of chronic problems and in not in great immediate distress. PresentationHe reports that he is experiencing a mild level of emotional distress characterized by dysphoria, anxiety and anhedonia. He is more sensitive and feels more intensely than most people. His feelings are easily hurt. He easily becomes impatient with people. He often is irritable and grouchy. It makes him angry when people hurry him and he gets angry with himself for giving in so much. He has become so angry that he does not know what comes over him.  Cognitions: He reports problems with attention and concentration. He is greatly bothered by forgetting where he put things. He tends to be mildly obsessive and ruminative and making important decisions makes him nervous. He is not assertive and finds it hard to defend his rights. He is easily defeated in an argument. He is apt to pass up something he wants to do because others think that it is not worth doing or he is not going about it in the right way. He often thinks he is not as good as other people. He has often lost out on things because he could not make up his mind soon enough.  periods his mind seems to work more slowly than usual. His judgment is better than  it ever has been. believes that others are only interested in their own welfare and he thinks that most people will use unfair means and stretch the truth to get ahead. Most people are honest chiefly because they are afraid of being caught. It takes a lot of argument to convince most people of the truth. He has often been misunderstood when he was trying to be helpful. He is not happy with himself the way he is. Relations: He reports that he is occasionally introverted. He wishes he was not so shy. He finds it difficult to talk when he meets new people or is in a group of people. He spends most of his spare time by himself. The only place he feels relaxed is in his own home.  Problem Areas: He reports few physical symptoms but believes he is not in as good physical health as most of his friends. He tires quickly and feels tired a good deal of the time. At times he is all full of energy.  His prognosis is guarded because he is experiencing little emotional distress that would motivate him for treatment. His problems are characterologic in nature and that complicates any therapeutic intervention. Short-term, behavioral interventions that focus on his reasons for entering treatment will be beneficial. Assertiveness or social-skill training may also be helpful.  There are a number of specific issues that must be kept in mind when establishing and maintaining the therapeutic alliance: he has difficulty starting to do things, he is very passive and nonassertive, it makes him nervous when people ask him personal questions, he is hard to get to know, he is very stubborn,  he is bothered greatly by the thought of making changes in his life and he hates going to doctors even when he is sick. Impression:of Bipolar DisorderPersonality Disorder NOS   Electronic Signatures: Garald Braver (PsyD, HSP-P)  (Signed on 01-Apr-13 15:29)  Authored  Last Updated: 01-Apr-13 15:29 by Garald Braver (PsyD, HSP-P)
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
## 2015-04-18 ENCOUNTER — Ambulatory Visit (INDEPENDENT_AMBULATORY_CARE_PROVIDER_SITE_OTHER): Payer: No Typology Code available for payment source | Admitting: Endocrinology

## 2015-04-18 ENCOUNTER — Encounter: Payer: Self-pay | Admitting: Endocrinology

## 2015-04-18 VITALS — BP 130/88 | HR 73 | Resp 12 | Ht 74.0 in | Wt 291.5 lb

## 2015-04-18 DIAGNOSIS — E291 Testicular hypofunction: Secondary | ICD-10-CM

## 2015-04-18 DIAGNOSIS — R7989 Other specified abnormal findings of blood chemistry: Secondary | ICD-10-CM

## 2015-04-18 LAB — IBC PANEL
Iron: 93 ug/dL (ref 42–165)
Saturation Ratios: 23.4 % (ref 20.0–50.0)
TRANSFERRIN: 284 mg/dL (ref 212.0–360.0)

## 2015-04-18 NOTE — Patient Instructions (Addendum)
Test for Testosterone. Further workup and treatment to be based on this.  Please come back for a follow-up appointment in 3 months

## 2015-04-18 NOTE — Progress Notes (Signed)
Patient ID: David Wheeler, male   DOB: 1981-05-09, 34 y.o.   MRN: [id number]    HPI: David Wheeler is a 34 y.o.-year-old man, referred by his Urologist, at [facility] urology Dr. and PCP, Dr.Sparks at [facility], for evaluation and management of low testosterone. Last visit Feb 2016 Reports that he was found to have very low levels of Testosterone about mid July 2015. Levels were 161 ( normal [phone]). Went to urology and started on Androgel- but didn't like that due to gel form- hoping to do injections. Stopped androgel late summer 2015 after using it for about 1 month.  Not on treatment right now since then. Don't have any urology notes for review.  Is on lithium treatment for his BPD for the past 2 years and dose unchanged recently- also started on prozac recently. Managed by Larene Beach at [facility].  Recent back surgery after injury- not recent steroids At prior visits, he had recent ESI for back pain. Was on percocet for 4 days then.   He is married and has one 1 son, 34  Years age. Not wanting any more children now.  Denies symptoms of decreased Testosterone as below.  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 Is supposed to go back to work tomorrow   Denies decreased libido or problems with erections  No trauma to testes, testicular irradiation or surgery No h/o of mumps orchitis/h/o autoimmune ds. No h/o cryptorchidism He grew and went through puberty like his peers No shrinking of testes. No very small testes (<5 ml)- had exam with Urology recently No incomplete/delayed sexual development     No breast discomfort/gynecomastia    No loss of body hair (axillary/pubic)/decreased need for shaving No height loss No abnormal sense of smell  No hot flushes No vision problems No worst HA of his life- has hx migraines- stable No FH of hypogonadism/infertility - is adopted No personal h/o infertility - has children No FH of hemochromatosis or pituitary tumors No  excessive weight gain or loss.  No chronic diseases No more than 2 drinks a day of alcohol at a time, and this is rarely No anabolic steroids use No herbal medicines  No AI ds in his family, no FH of MS. He does not have family history of early cardiac disease. No prior hx OSA or blood clots.    I have reviewed the patient's past medical history, family and social history, surgical history, medications and allergies.  Past Medical History  Diagnosis Date  . Depression   . Allergy   . GERD (gastroesophageal reflux disease)   . Hypertension   . Cancer     skin cancer  . Right-sided Bell's palsy   . Alcohol use   . Substance abuse   . Nephrolithiasis    Past Surgical History  Procedure Laterality Date  . Cholecystectomy    . Tonsillectomy and adenoidectomy  2002  . Back surgery  Feb. 2016   No family history on file. History   Social History  . Marital Status: Married    Spouse Name: N/A  . Number of Children: N/A  . Years of Education: N/A   Occupational History  . Not on file.   Social History Main Topics  . Smoking status: Former Research scientist (life sciences)  . Smokeless tobacco: Never Used  . Alcohol Use: 0.0 oz/week    0 Standard drinks or equivalent per week  . Drug Use: No  . Sexual Activity: Not on file  Other Topics Concern  . Not on file   Social History Narrative   Current Outpatient Prescriptions on File Prior to Visit  Medication Sig Dispense Refill  . butalbital-acetaminophen-caffeine (FIORICET, ESGIC) 50-325-40 MG per tablet Take 1 tablet by mouth every 4 (four) hours as needed.    Marland Kitchen FLUoxetine (PROZAC) 40 MG capsule Take 2 capsules by mouth daily.    Marland Kitchen gabapentin (NEURONTIN) 300 MG capsule 1 po tid    . lithium carbonate 300 MG capsule TAKE 2 CAPSULES (600 MG TOTAL) BY MOUTH 2 (TWO) TIMES DAILY.    . bisoprolol-hydrochlorothiazide (ZIAC) 5-6.25 MG per tablet Take 1 tablet by mouth daily.     No current facility-administered medications on file prior to visit.    Allergies  Allergen Reactions  . Shellfish Allergy Anaphylaxis  . Contrast Media  [Iodinated Diagnostic Agents] Hives  . Latex Rash    PE: BP 130/88 mmHg  Pulse 73  Resp 12  Ht 6\' 2"  (1.88 m)  Wt 291 lb 8 oz (132.224 kg)  BMI 37.41 kg/m2  SpO2 98% Wt Readings from Last 3 Encounters:  04/18/15 291 lb 8 oz (132.224 kg)  01/18/15 294 lb 8 oz (133.584 kg)  01/06/15 295 lb 12.8 oz (134.174 kg)   HEENT: /AT, EOMI, no icterus, no proptosis, no chemosis, no mild lid lag, no retraction, eyes close completely, gross normal VF testing on confrontation Neck: thyroid gland - smooth, non-tender, no erythema, no tracheal deviation; negative Pemberton's sign; no lymphadenopathy; no bruits Lungs: good air entry, clear bilaterally Heart: S1&S2 normal, regular rate & rhythm; no murmurs, rubs or gallops Abd: soft, NT, ND, no HSM, +BS,  Ext: no tremor in hands bilaterally, no edema, 2+ DP/PT pulses, good muscle mass Neuro: normal gait, 2+ reflexes bilaterally, normal 5/5 strength, no proximal myopathy  Derm: no pretibial myxoedema/skin dryness Genital exam: deferred done recently by Urology  ASSESSMENT: 1. Low testosterone   Problem List Items Addressed This Visit      Other   Low serum testosterone level - Primary    We discussed about Hypogonadism- causes, workup , symptoms and treatment options. We discussed about effects of various medications on Testosterone levels including  use of Steroids and opiates. Low T could be also related to low SHBG or shift work.  He has had one prior low Testosterone level, and I am not sure whether his workup was completed prior to the Androgel trial. Last Testosterone was not done by LCMS  - Currently not on treatment with Testosterone  - Will test for hypogonadism workup - Eitherways, he is not symptomatic from "low T".   -He is agreeable to the plan. I Need to confirm his diagnosis first prior to consideration of treatment.    RTC 47months         Relevant Orders   Testosterone, Total, LC/MS (Completed)   FSH/LH (Completed)   IBC panel (Completed)   Prolactin (Completed)      Ramesses Crampton PUSHKAR 04/21/2015 11:47 AM

## 2015-04-18 NOTE — Progress Notes (Signed)
Pre visit review using our clinic review tool, if applicable. No additional management support is needed unless otherwise documented below in the visit note. 

## 2015-04-19 LAB — FSH/LH
FSH: 3.7 m[IU]/mL (ref 1.4–18.1)
LH: 3.8 m[IU]/mL (ref 1.5–9.3)

## 2015-04-19 LAB — PROLACTIN: PROLACTIN: 6.4 ng/mL (ref 2.1–17.1)

## 2015-04-21 LAB — TESTOSTERONE, TOTAL, LC/MS: TESTOSTERONE, TOTAL: 274.5 ng/dL — AB (ref 348.0–1197.0)

## 2015-04-21 NOTE — Addendum Note (Signed)
Addended by: Haydee Monica on: 04/21/2015 11:48 AM   Modules accepted: Level of Service

## 2015-04-21 NOTE — Assessment & Plan Note (Signed)
We discussed about Hypogonadism- causes, workup , symptoms and treatment options. We discussed about effects of various medications on Testosterone levels including  use of Steroids and opiates. Low T could be also related to low SHBG or shift work.  He has had one prior low Testosterone level, and I am not sure whether his workup was completed prior to the Androgel trial. Last Testosterone was not done by LCMS  - Currently not on treatment with Testosterone  - Will test for hypogonadism workup - Eitherways, he is not symptomatic from "low T".   -He is agreeable to the plan. I Need to confirm his diagnosis first prior to consideration of treatment.    RTC 69months

## 2015-04-28 LAB — TESTOSTERONE, FREE/TOT EQUILIB
TESTOSTERONE: 246 ng/dL — AB (ref 348–1197)
Testosterone, Free Pct: 2.61 % (ref 1.50–4.20)
Testosterone,Free: 6.42 ng/dL (ref 5.00–21.00)

## 2015-04-28 LAB — SPECIMEN STATUS REPORT

## 2015-06-29 ENCOUNTER — Other Ambulatory Visit: Payer: Self-pay | Admitting: Internal Medicine

## 2015-06-29 DIAGNOSIS — R1010 Upper abdominal pain, unspecified: Secondary | ICD-10-CM

## 2015-07-03 ENCOUNTER — Ambulatory Visit
Admission: RE | Admit: 2015-07-03 | Discharge: 2015-07-03 | Disposition: A | Discharge status: Home / Self Care | Payer: PRIVATE HEALTH INSURANCE | Source: Ambulatory Visit | Attending: Internal Medicine | Admitting: Internal Medicine

## 2015-07-03 DIAGNOSIS — R1010 Upper abdominal pain, unspecified: Secondary | ICD-10-CM | POA: Insufficient documentation

## 2015-07-03 DIAGNOSIS — K760 Fatty (change of) liver, not elsewhere classified: Secondary | ICD-10-CM | POA: Insufficient documentation

## 2015-07-19 ENCOUNTER — Ambulatory Visit: Payer: No Typology Code available for payment source | Admitting: Endocrinology

## 2015-08-29 ENCOUNTER — Other Ambulatory Visit: Payer: Self-pay | Admitting: Internal Medicine

## 2015-09-05 ENCOUNTER — Other Ambulatory Visit: Payer: Self-pay | Admitting: Nurse Practitioner

## 2015-09-05 DIAGNOSIS — R1032 Left lower quadrant pain: Secondary | ICD-10-CM

## 2015-09-05 DIAGNOSIS — K625 Hemorrhage of anus and rectum: Secondary | ICD-10-CM

## 2015-09-05 DIAGNOSIS — R1012 Left upper quadrant pain: Secondary | ICD-10-CM

## 2015-09-05 DIAGNOSIS — K760 Fatty (change of) liver, not elsewhere classified: Secondary | ICD-10-CM

## 2015-09-05 DIAGNOSIS — R197 Diarrhea, unspecified: Secondary | ICD-10-CM

## 2015-09-07 ENCOUNTER — Ambulatory Visit
Admission: RE | Admit: 2015-09-07 | Discharge: 2015-09-07 | Disposition: A | Discharge status: Home / Self Care | Payer: PRIVATE HEALTH INSURANCE | Source: Ambulatory Visit | Attending: Nurse Practitioner | Admitting: Nurse Practitioner

## 2015-09-07 ENCOUNTER — Ambulatory Visit: Payer: PRIVATE HEALTH INSURANCE

## 2015-09-07 ENCOUNTER — Encounter: Payer: Self-pay | Admitting: *Deleted

## 2015-09-07 DIAGNOSIS — R197 Diarrhea, unspecified: Secondary | ICD-10-CM

## 2015-09-07 DIAGNOSIS — R1012 Left upper quadrant pain: Secondary | ICD-10-CM

## 2015-09-07 DIAGNOSIS — K429 Umbilical hernia without obstruction or gangrene: Secondary | ICD-10-CM | POA: Insufficient documentation

## 2015-09-07 DIAGNOSIS — K760 Fatty (change of) liver, not elsewhere classified: Secondary | ICD-10-CM | POA: Diagnosis not present

## 2015-09-07 DIAGNOSIS — Z85820 Personal history of malignant melanoma of skin: Secondary | ICD-10-CM | POA: Insufficient documentation

## 2015-09-07 DIAGNOSIS — R911 Solitary pulmonary nodule: Secondary | ICD-10-CM | POA: Insufficient documentation

## 2015-09-07 DIAGNOSIS — K625 Hemorrhage of anus and rectum: Secondary | ICD-10-CM

## 2015-09-07 DIAGNOSIS — R1032 Left lower quadrant pain: Secondary | ICD-10-CM | POA: Diagnosis present

## 2015-09-07 MED ORDER — IOHEXOL 300 MG/ML  SOLN
100.0000 mL | Freq: Once | INTRAMUSCULAR | Status: AC | PRN
  Administered 2015-09-07: 100 mL via INTRAVENOUS

## 2015-09-12 NOTE — Discharge Instructions (Signed)

## 2015-09-13 ENCOUNTER — Ambulatory Visit: Payer: PRIVATE HEALTH INSURANCE | Admitting: Anesthesiology

## 2015-09-13 ENCOUNTER — Encounter
Admission: RE | Disposition: A | Discharge status: Home / Self Care | Payer: Self-pay | Source: Ambulatory Visit | Attending: Gastroenterology

## 2015-09-13 ENCOUNTER — Ambulatory Visit
Admission: RE | Admit: 2015-09-13 | Discharge: 2015-09-13 | Disposition: A | Discharge status: Home / Self Care | Payer: PRIVATE HEALTH INSURANCE | Source: Ambulatory Visit | Attending: Gastroenterology | Admitting: Gastroenterology

## 2015-09-13 DIAGNOSIS — M5126 Other intervertebral disc displacement, lumbar region: Secondary | ICD-10-CM | POA: Diagnosis not present

## 2015-09-13 DIAGNOSIS — G510 Bell's palsy: Secondary | ICD-10-CM | POA: Insufficient documentation

## 2015-09-13 DIAGNOSIS — Z87442 Personal history of urinary calculi: Secondary | ICD-10-CM | POA: Insufficient documentation

## 2015-09-13 DIAGNOSIS — F519 Sleep disorder not due to a substance or known physiological condition, unspecified: Secondary | ICD-10-CM | POA: Insufficient documentation

## 2015-09-13 DIAGNOSIS — M5137 Other intervertebral disc degeneration, lumbosacral region: Secondary | ICD-10-CM | POA: Insufficient documentation

## 2015-09-13 DIAGNOSIS — Z79899 Other long term (current) drug therapy: Secondary | ICD-10-CM | POA: Diagnosis not present

## 2015-09-13 DIAGNOSIS — R1032 Left lower quadrant pain: Secondary | ICD-10-CM | POA: Diagnosis not present

## 2015-09-13 DIAGNOSIS — M5431 Sciatica, right side: Secondary | ICD-10-CM | POA: Insufficient documentation

## 2015-09-13 DIAGNOSIS — F3189 Other bipolar disorder: Secondary | ICD-10-CM | POA: Diagnosis not present

## 2015-09-13 DIAGNOSIS — Z6838 Body mass index (BMI) 38.0-38.9, adult: Secondary | ICD-10-CM | POA: Diagnosis not present

## 2015-09-13 DIAGNOSIS — G4733 Obstructive sleep apnea (adult) (pediatric): Secondary | ICD-10-CM | POA: Diagnosis not present

## 2015-09-13 DIAGNOSIS — E291 Testicular hypofunction: Secondary | ICD-10-CM | POA: Insufficient documentation

## 2015-09-13 DIAGNOSIS — K219 Gastro-esophageal reflux disease without esophagitis: Secondary | ICD-10-CM | POA: Diagnosis not present

## 2015-09-13 DIAGNOSIS — E6609 Other obesity due to excess calories: Secondary | ICD-10-CM | POA: Diagnosis not present

## 2015-09-13 DIAGNOSIS — K625 Hemorrhage of anus and rectum: Secondary | ICD-10-CM | POA: Insufficient documentation

## 2015-09-13 DIAGNOSIS — R197 Diarrhea, unspecified: Secondary | ICD-10-CM | POA: Diagnosis not present

## 2015-09-13 DIAGNOSIS — I10 Essential (primary) hypertension: Secondary | ICD-10-CM | POA: Diagnosis not present

## 2015-09-13 DIAGNOSIS — Z87891 Personal history of nicotine dependence: Secondary | ICD-10-CM | POA: Insufficient documentation

## 2015-09-13 DIAGNOSIS — R194 Change in bowel habit: Secondary | ICD-10-CM | POA: Insufficient documentation

## 2015-09-13 DIAGNOSIS — Z85828 Personal history of other malignant neoplasm of skin: Secondary | ICD-10-CM | POA: Diagnosis not present

## 2015-09-13 DIAGNOSIS — M5416 Radiculopathy, lumbar region: Secondary | ICD-10-CM | POA: Insufficient documentation

## 2015-09-13 HISTORY — DX: Adverse effect of unspecified anesthetic, initial encounter: T41.45XA

## 2015-09-13 HISTORY — DX: Headache, unspecified: R51.9

## 2015-09-13 HISTORY — DX: Other complications of anesthesia, initial encounter: T88.59XA

## 2015-09-13 HISTORY — PX: COLONOSCOPY: SHX5424

## 2015-09-13 HISTORY — DX: Headache: R51

## 2015-09-13 HISTORY — DX: Sleep apnea, unspecified: G47.30

## 2015-09-13 SURGERY — COLONOSCOPY
Anesthesia: Monitor Anesthesia Care

## 2015-09-13 MED ORDER — LIDOCAINE HCL (CARDIAC) 20 MG/ML IV SOLN
INTRAVENOUS | Status: DC | PRN
  Administered 2015-09-13: 30 mg via INTRAVENOUS

## 2015-09-13 MED ORDER — STERILE WATER FOR IRRIGATION IR SOLN
Status: DC | PRN
  Administered 2015-09-13: 09:00:00

## 2015-09-13 MED ORDER — PROPOFOL 10 MG/ML IV BOLUS: INTRAVENOUS | Status: DC | PRN

## 2015-09-13 MED ORDER — LACTATED RINGERS IV SOLN
INTRAVENOUS | Status: DC
  Administered 2015-09-13: 08:00:00 via INTRAVENOUS

## 2015-09-13 MED ORDER — PROPOFOL 10 MG/ML IV BOLUS
INTRAVENOUS | Status: DC | PRN
  Administered 2015-09-13 (×3): 20 mg via INTRAVENOUS

## 2015-09-13 SURGICAL SUPPLY — 30 items

## 2015-09-13 NOTE — Op Note (Signed)
[facility] Patient Name: David Wheeler Procedure Date: 09/13/2015 8:38 AM MRN: [id number] Account #: [id number] Date of Birth: 03-16-1981 Admit Type: Outpatient Age: 34 Room: [facility] OR ROOM 01 Gender: Male Note Status: Finalized Procedure:         Colonoscopy Indications:       Abdominal pain in the left lower quadrant, Clinically                     significant diarrhea of unexplained origin, Change in                     bowel habits Providers:         Lupita Dawn. Candace Cruise, MD Referring MD:      Leonie Douglas. Doy Hutching, MD (Referring MD) Medicines:         Monitored Anesthesia Care Complications:     No immediate complications. Procedure:         Pre-Anesthesia Assessment:                    - Prior to the procedure, a History and Physical was                     performed, and patient medications, allergies and                     sensitivities were reviewed. The patient's tolerance of                     previous anesthesia was reviewed.                    - The risks and benefits of the procedure and the sedation                     options and risks were discussed with the patient. All                     questions were answered and informed consent was obtained.                    - After reviewing the risks and benefits, the patient was                     deemed in satisfactory condition to undergo the procedure.                    After obtaining informed consent, the colonoscope was                     passed under direct vision. Throughout the procedure, the                     patient's blood pressure, pulse, and oxygen saturations                     were monitored continuously. The Olympus CF H180AL                     colonoscope (S#: [id number]) was introduced through the anus                     and advanced to the the terminal ileum, with  identification of the appendiceal orifice and IC valve.                     The colonoscopy  was performed without difficulty. The                     patient tolerated the procedure well. The quality of the                     bowel preparation was good. Findings:      The colon (entire examined portion) appeared normal. Biopsies for       histology were taken with a cold forceps from the entire colon for       evaluation of microscopic colitis.      The terminal ileum appeared normal. Biopsies were taken with a cold       forceps for histology. Impression:        - The entire examined colon is normal. Biopsied.                    - The examined portion of the ileum was normal. Biopsied. Recommendation:    - Discharge patient to home.                    - The findings and recommendations were discussed with the                     patient.                    - Await pathology results. Procedure Code(s): --- Professional ---                    [phone], Colonoscopy, flexible; with biopsy, single or                     multiple Diagnosis Code(s): --- Professional ---                    R10.32, Left lower quadrant pain                    R19.7, Diarrhea, unspecified                    R19.4, Change in bowel habit CPT copyright 2014 American Medical Association. All rights reserved. The codes documented in this report are preliminary and upon coder review may  be revised to meet current compliance requirements. Hulen Luster, MD 09/13/2015 8:56:46 AM This report has been signed electronically. Number of Addenda: 0 Note Initiated On: 09/13/2015 8:38 AM Scope Withdrawal Time: 0 hours 5 minutes 14 seconds  Total Procedure Duration: 0 hours 8 minutes 29 seconds       [facility]

## 2015-09-13 NOTE — Anesthesia Postprocedure Evaluation (Signed)
  Anesthesia Post-op Note  Patient: David Wheeler  Procedure(s) Performed: Procedure(s) with comments: COLONOSCOPY (N/A) - CPAP Latex sensitivity  Anesthesia type:MAC  Patient location: PACU  Post pain: Pain level controlled  Post assessment: Post-op Vital signs reviewed, Patient's Cardiovascular Status Stable, Respiratory Function Stable, Patent Airway and No signs of Nausea or vomiting  Post vital signs: Reviewed and stable  Last Vitals:  Filed Vitals:   09/13/15 0911  BP: 117/69  Pulse: 70  Temp:   Resp: 16    Level of consciousness: awake, alert  and patient cooperative  Complications: No apparent anesthesia complications

## 2015-09-13 NOTE — Anesthesia Procedure Notes (Signed)
Procedure Name: MAC Performed by: Conor Lata Pre-anesthesia Checklist: Patient identified, Emergency Drugs available, Suction available, Patient being monitored and Timeout performed Patient Re-evaluated:Patient Re-evaluated prior to inductionOxygen Delivery Method: Nasal cannula Intubation Type: IV induction     

## 2015-09-13 NOTE — Anesthesia Preprocedure Evaluation (Signed)
Anesthesia Evaluation  Patient identified by MRN, date of birth, ID band  Reviewed: Allergy & Precautions, H&P , NPO status , Patient's Chart, lab work & pertinent test results  Airway Mallampati: I  TM Distance: >3 FB Neck ROM: full    Dental no notable dental hx.    Pulmonary sleep apnea , former smoker,    Pulmonary exam normal        Cardiovascular hypertension,  Rhythm:regular Rate:Normal     Neuro/Psych PSYCHIATRIC DISORDERS    GI/Hepatic   Endo/Other    Renal/GU      Musculoskeletal   Abdominal   Peds  Hematology   Anesthesia Other Findings   Reproductive/Obstetrics                             Anesthesia Physical Anesthesia Plan  ASA: II  Anesthesia Plan: MAC   Post-op Pain Management:    Induction:   Airway Management Planned:   Additional Equipment:   Intra-op Plan:   Post-operative Plan:   Informed Consent: I have reviewed the patients History and Physical, chart, labs and discussed the procedure including the risks, benefits and alternatives for the proposed anesthesia with the patient or authorized representative who has indicated his/her understanding and acceptance.     Plan Discussed with: CRNA  Anesthesia Plan Comments:         Anesthesia Quick Evaluation

## 2015-09-13 NOTE — Transfer of Care (Signed)
Immediate Anesthesia Transfer of Care Note  Patient: David Wheeler  Procedure(s) Performed: Procedure(s) with comments: COLONOSCOPY (N/A) - CPAP Latex sensitivity  Patient Location: PACU  Anesthesia Type: MAC  Level of Consciousness: awake, alert  and patient cooperative  Airway and Oxygen Therapy: Patient Spontanous Breathing and Patient connected to supplemental oxygen  Post-op Assessment: Post-op Vital signs reviewed, Patient's Cardiovascular Status Stable, Respiratory Function Stable, Patent Airway and No signs of Nausea or vomiting  Post-op Vital Signs: Reviewed and stable  Complications: No apparent anesthesia complications

## 2015-09-13 NOTE — H&P (Signed)
  Date of Initial H&P:09/05/2015 History reviewed, patient examined, no change in status, stable for surgery.

## 2015-09-14 ENCOUNTER — Encounter: Payer: Self-pay | Admitting: Gastroenterology

## 2015-09-15 LAB — SURGICAL PATHOLOGY

## 2015-09-20 ENCOUNTER — Other Ambulatory Visit: Payer: Self-pay | Admitting: Specialist

## 2015-09-20 DIAGNOSIS — R911 Solitary pulmonary nodule: Secondary | ICD-10-CM

## 2015-09-20 DIAGNOSIS — R0602 Shortness of breath: Secondary | ICD-10-CM

## 2015-10-03 ENCOUNTER — Other Ambulatory Visit: Payer: Self-pay | Admitting: Nurse Practitioner

## 2015-10-03 DIAGNOSIS — K760 Fatty (change of) liver, not elsewhere classified: Secondary | ICD-10-CM

## 2015-10-05 ENCOUNTER — Other Ambulatory Visit: Payer: Self-pay | Admitting: Nurse Practitioner

## 2015-10-05 DIAGNOSIS — K760 Fatty (change of) liver, not elsewhere classified: Secondary | ICD-10-CM

## 2015-10-11 ENCOUNTER — Ambulatory Visit: Admission: RE | Admit: 2015-10-11 | Payer: PRIVATE HEALTH INSURANCE | Source: Ambulatory Visit

## 2015-10-11 ENCOUNTER — Ambulatory Visit: Payer: PRIVATE HEALTH INSURANCE

## 2015-10-11 ENCOUNTER — Ambulatory Visit
Admission: RE | Admit: 2015-10-11 | Discharge: 2015-10-11 | Disposition: A | Discharge status: Home / Self Care | Payer: PRIVATE HEALTH INSURANCE | Source: Ambulatory Visit | Attending: Nurse Practitioner | Admitting: Nurse Practitioner

## 2015-10-11 DIAGNOSIS — Z9049 Acquired absence of other specified parts of digestive tract: Secondary | ICD-10-CM | POA: Diagnosis not present

## 2015-10-11 DIAGNOSIS — K760 Fatty (change of) liver, not elsewhere classified: Secondary | ICD-10-CM

## 2016-03-21 ENCOUNTER — Ambulatory Visit: Payer: PRIVATE HEALTH INSURANCE | Attending: Specialist

## 2016-04-04 IMAGING — CR DG LUMBAR SPINE 2-3V
1 series · 3 of 3 positions shown · non-contrast
Comparison: None.

CLINICAL DATA: Pain after fall, involving the right leg. Initial
encounter.

EXAM:
LUMBAR SPINE - 2-3 VIEW

[Series 1: dxr lumbar spine ap and lateral · 0.14mm/px · 3 of 3 slices shown]
[im 1/3]
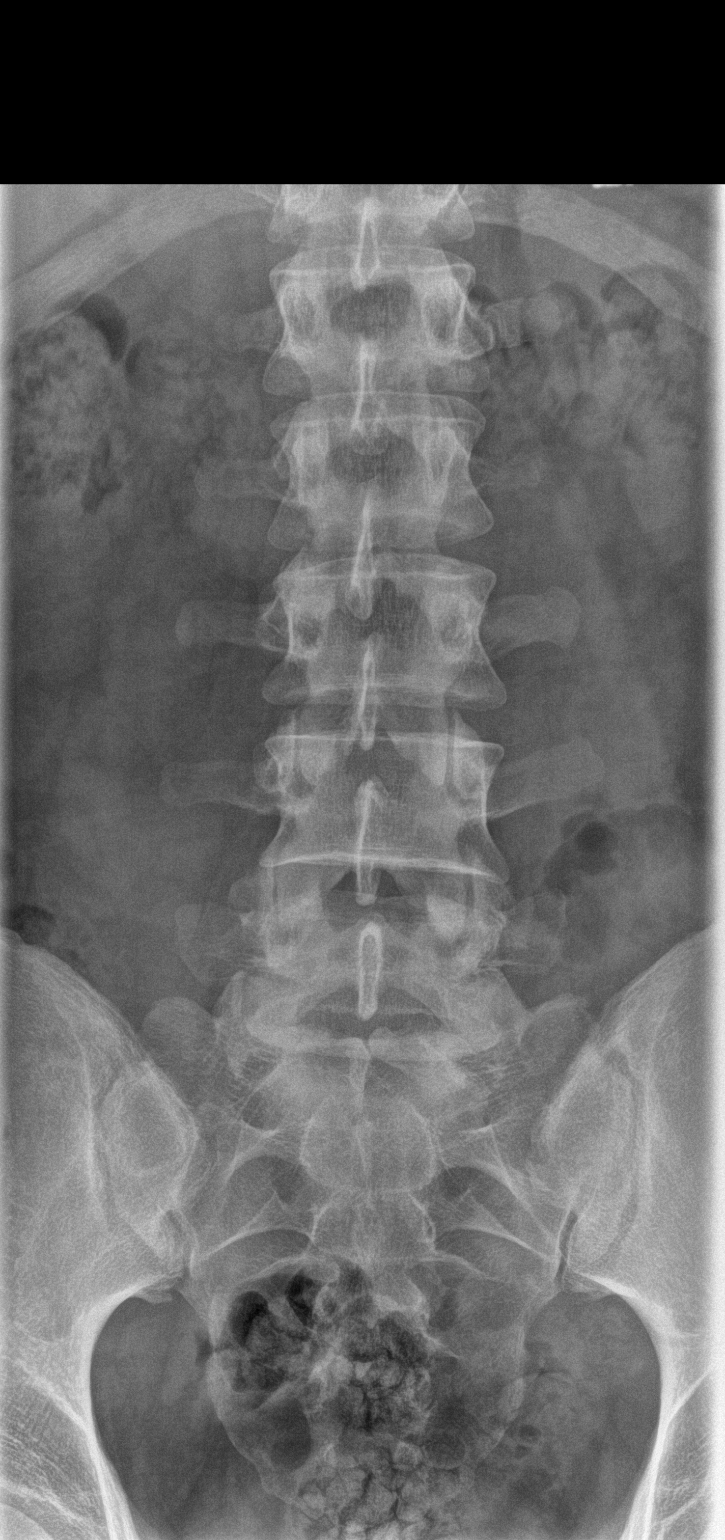
[im 2/3]
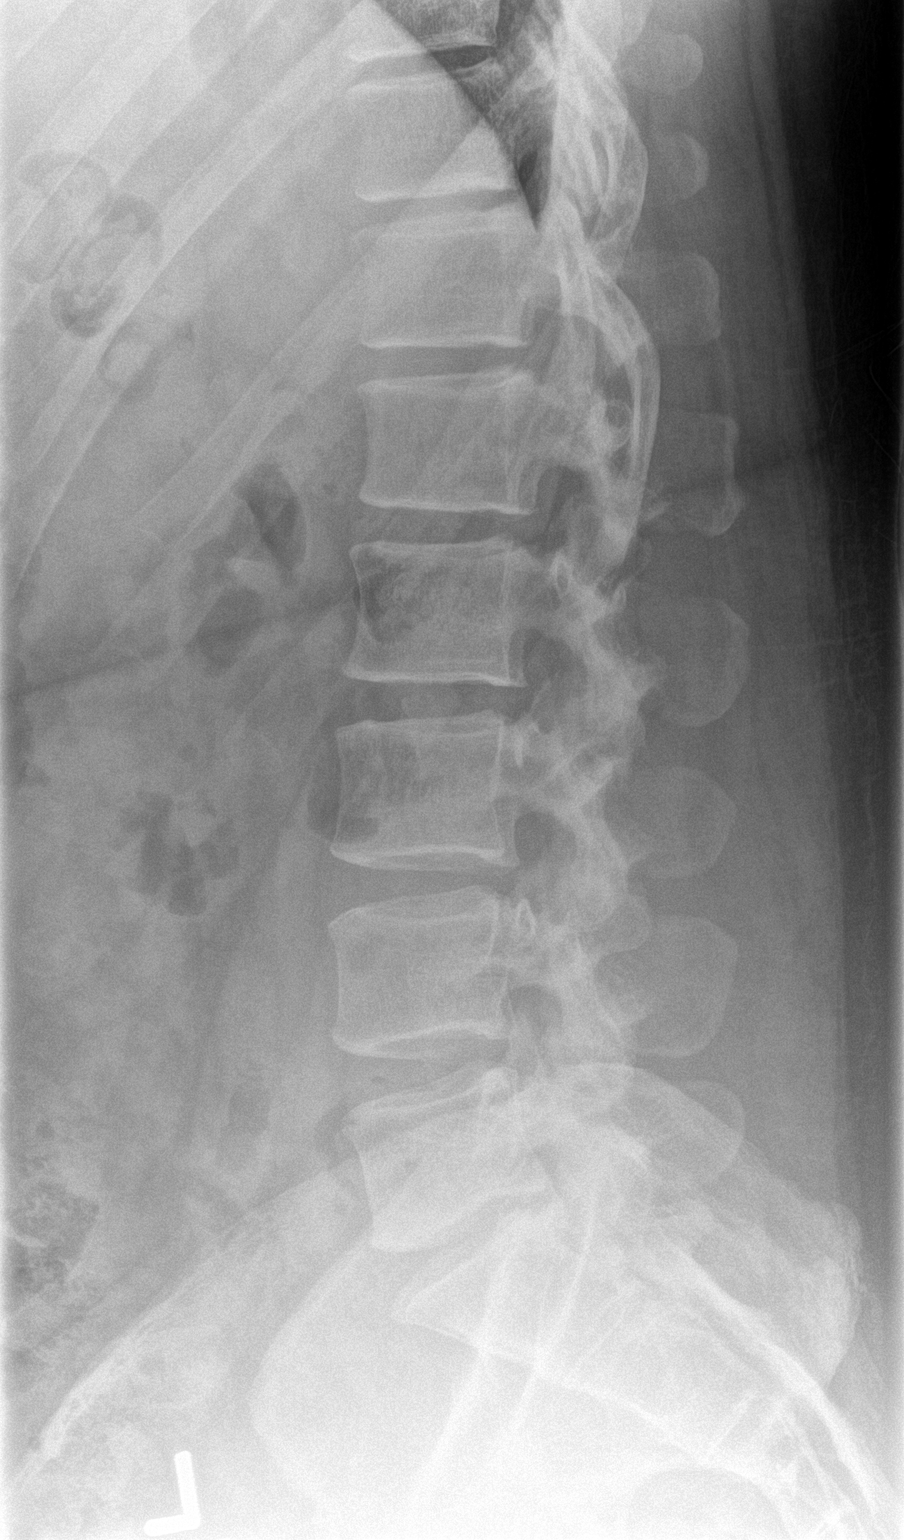
[im 3/3]
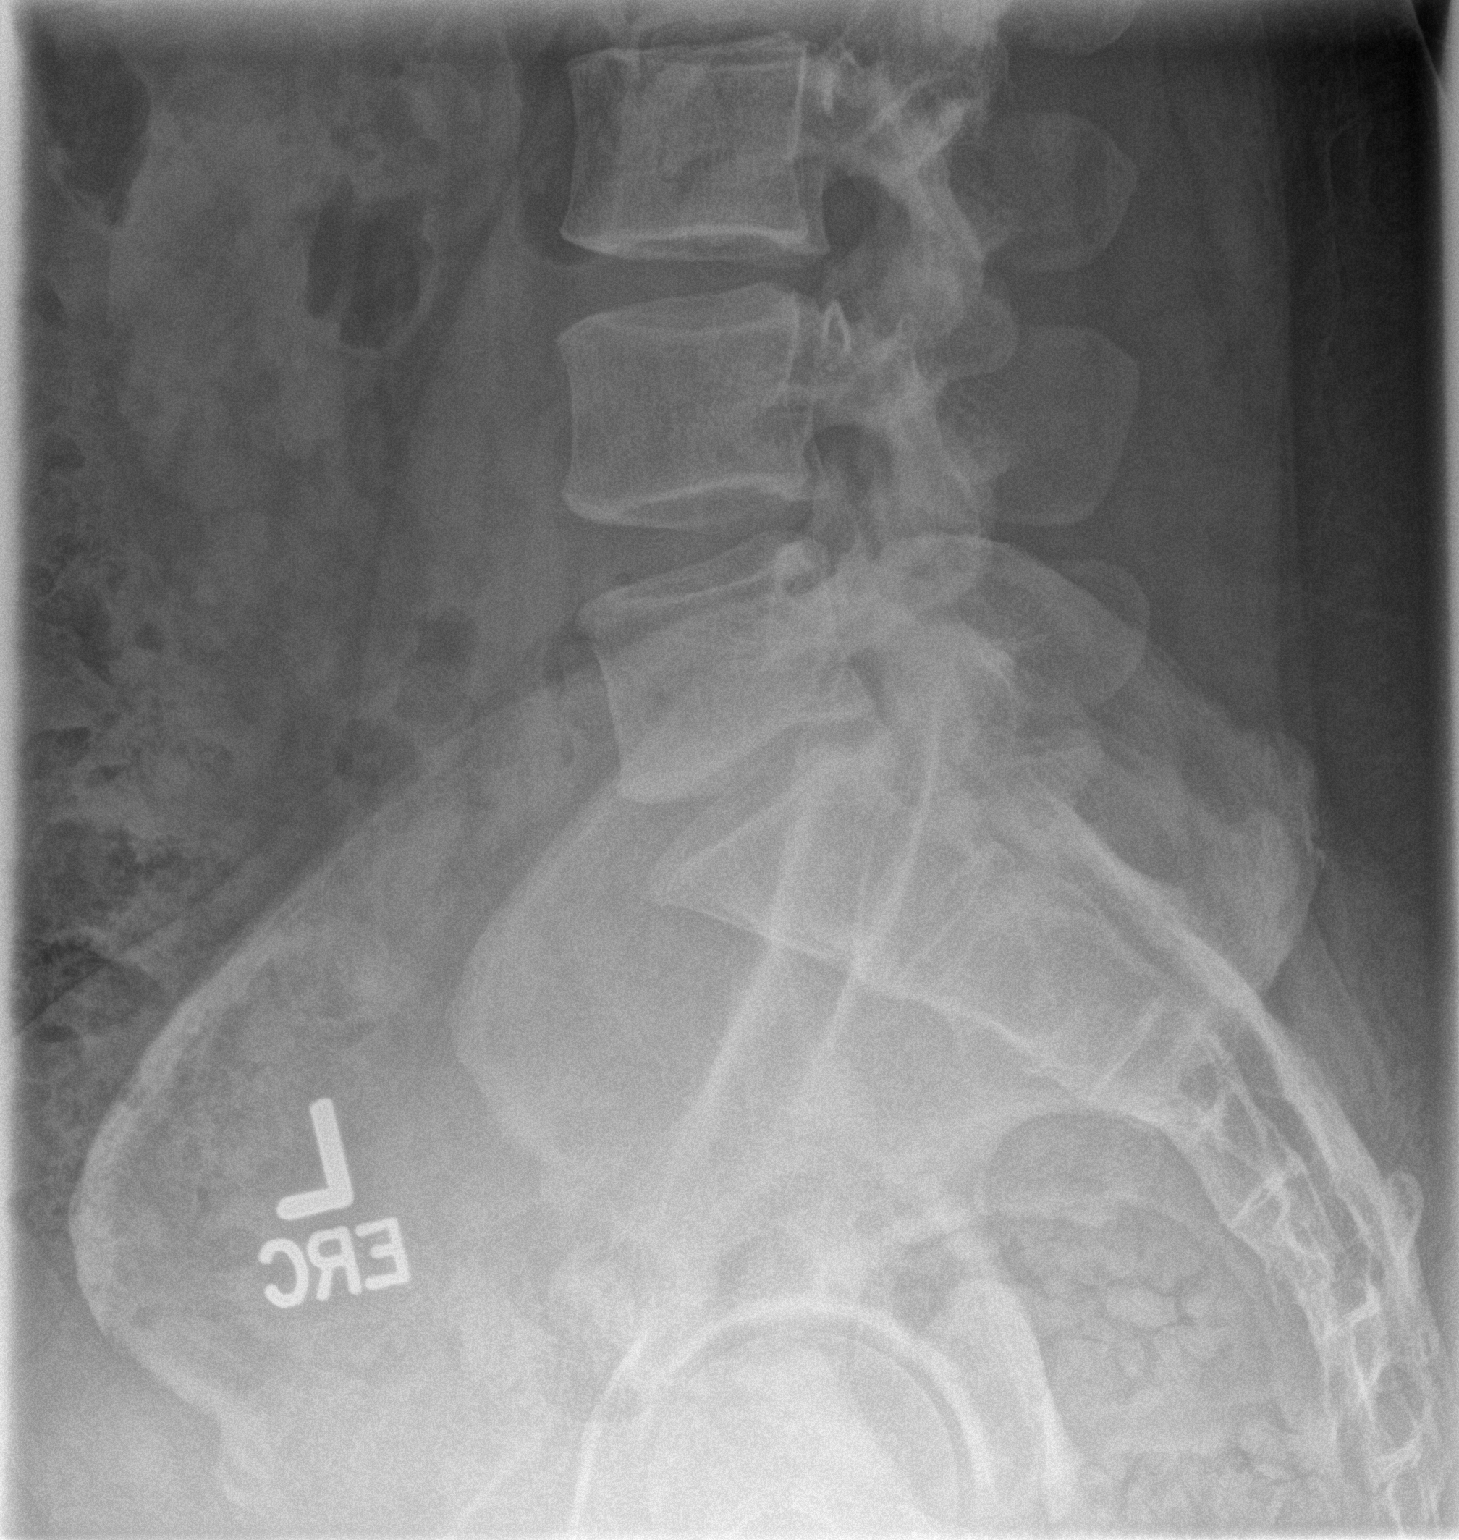

[3 of 3 positions shown; findings below may reference images not displayed]

FINDINGS: There is no evidence of lumbar spine fracture. Alignment is normal.
Intervertebral disc spaces are maintained.
IMPRESSION: Negative.

## 2016-04-15 ENCOUNTER — Encounter: Payer: Self-pay | Admitting: *Deleted

## 2016-04-16 ENCOUNTER — Encounter: Payer: Self-pay | Admitting: Urology

## 2016-04-16 ENCOUNTER — Ambulatory Visit (INDEPENDENT_AMBULATORY_CARE_PROVIDER_SITE_OTHER): Payer: Managed Care, Other (non HMO) | Admitting: Urology

## 2016-04-16 VITALS — BP 148/88 | HR 75 | Ht 74.0 in | Wt 311.8 lb

## 2016-04-16 DIAGNOSIS — Z309 Encounter for contraceptive management, unspecified: Secondary | ICD-10-CM | POA: Diagnosis not present

## 2016-04-16 DIAGNOSIS — Z3009 Encounter for other general counseling and advice on contraception: Secondary | ICD-10-CM | POA: Insufficient documentation

## 2016-04-16 MED ORDER — DIAZEPAM 10 MG PO TABS: ORAL_TABLET | ORAL | Status: AC

## 2016-04-16 NOTE — Progress Notes (Signed)
04/16/2016 9:48 AM   David Wheeler 1981-07-09 [id number]  Referring provider: Idelle Crouch, MD [facility] [place], [place] [address]  Chief Complaint  Patient presents with  . VAS Consult    HPI: David Wheeler is a 35 year old Caucasian presents today requesting a vasectomy.  Patient's wife has been having complications with her IUD and they need a permanent form of birth control.    Patient denied any history of chronic prostatitis, epididymitis, orchitis, or other genital pain.  Reviewal of his chart noted that one year ago he was having left testicular pain.  Scrotal ultrasound performed on 12/16/2014 noted small bilateral hydroceles, a 35mm epididymal cyst and a 1.7 cm epididymal cyst.   Today, we discussed what the vas deferens is, where it is located, and its function. We reviewed the procedure for vasectomy, it's risks, benefits, alternatives, and likelihood of achieving his goals.   We discussed in detail the procedure, complications, and recovery as well as the need for clearance prior to unprotected intercourse. We discussed that vasectomy does not protect against sexually transmitted diseases. We discussed that this procedure does not result in immediate sterility and that they would need to use other forms of birth control until he has been cleared with a three month negative postvasectomy semen analyses.  I explained that the procedure is considered to be permanent and that attempts at reversal have varying degrees of success. These options include vasectomy reversal, sperm retrieval, and in vitro fertilization; these can be very expensive.   We discussed the chance of postvasectomy pain syndrome which occurs in less than 5% of patients. I explained to the patient that there is no treatment to resolve this chronic pain, and that if it developed I would not be able to help resolve the issue, but that surgery is generally not  needed for correction.   I explained there have even been reports of systemic like illness associated with this chronic pain, and that there was no good cure. I explained that vasectomy it is not a 100% reliable form of birth control, and the risk of pregnancy after vasectomy is approximately 1 in 2000 men who had a negative postvasectomy semen analysis or rare non-motile sperm.  I explained that repeat vasectomy was necessary in less than 1% of vasectomy procedures when employing the type of technique that is performed in the office. I explained that he should refrain from ejaculation for approximately one week following vasectomy. I explained that there are other options for birth control which are permanent and non-permanent; we discussed these.  I explained the rates of surgical complications, such as symptomatic hematoma or infection, are low (1-2%) and vary with the surgeon's experience and criteria used to diagnose the complication.   PMH: Past Medical History  Diagnosis Date  . Depression   . Allergy   . GERD (gastroesophageal reflux disease)   . Hypertension   . Cancer ([place])     skin cancer  . Right-sided Bell's palsy 2015    resolved  . Alcohol use ([place])   . Substance abuse   . Nephrolithiasis   . Complication of anesthesia     slow to wake after back surgery  . Headache     migraines - 1/month  . Sleep apnea     has CPAP, doesn't use  . Bipolar affective ([place])   . Hypogonadism in male   . Testicular pain     left  .  Adopted     Surgical History: Past Surgical History  Procedure Laterality Date  . Cholecystectomy    . Tonsillectomy and adenoidectomy  2002  . Back surgery  Feb. 2016  . Colonoscopy N/A 09/13/2015    Procedure: COLONOSCOPY;  Surgeon: Hulen Luster, MD;  Location: [place];  Service: Gastroenterology;  Laterality: N/A;  CPAP Latex sensitivity    Home Medications:    Medication List       This list is accurate as of: 04/16/16  9:48 AM.   Always use your most recent med list.               bisoprolol-hydrochlorothiazide 5-6.25 MG tablet  Commonly known as:  ZIAC  Take 1 tablet by mouth daily. AM     butalbital-acetaminophen-caffeine 50-325-40 MG tablet  Commonly known as:  FIORICET, ESGIC  Take 1 tablet by mouth every 4 (four) hours as needed.     diazepam 10 MG tablet  Commonly known as:  VALIUM  Take 30 minutes prior to the vasectomy     diphenoxylate-atropine 2.5-0.025 MG tablet  Commonly known as:  LOMOTIL  Take by mouth 4 (four) times daily as needed for diarrhea or loose stools.     FLUoxetine 40 MG capsule  Commonly known as:  PROZAC  Take 2 capsules by mouth daily. PM     gabapentin 300 MG capsule  Commonly known as:  NEURONTIN  Reported on 04/16/2016     HYDROcodone-acetaminophen 10-325 MG tablet  Commonly known as:  NORCO  Take 1 tablet by mouth every 6 (six) hours as needed. Reported on 04/16/2016     lithium carbonate 300 MG capsule  TAKE 2 CAPSULES (600 MG TOTAL) BY MOUTH 2 (TWO) TIMES DAILY.     omeprazole 40 MG capsule  Commonly known as:  PRILOSEC  Take 40 mg by mouth daily.     testosterone cypionate 100 MG/ML injection  Commonly known as:  DEPOTESTOTERONE CYPIONATE  Inject 200 mg into the muscle every 14 (fourteen) days. For IM use only     VIBERZI 100 MG Tabs  Generic drug:  Eluxadoline  Take by mouth.        Allergies:  Allergies  Allergen Reactions  . Contrast Media [Iodinated Diagnostic Agents] Anaphylaxis  . Shellfish Allergy Anaphylaxis  . Latex Rash    Only when gloves are worn by pt.    Family History: No family history on file.  Social History:  reports that he has quit smoking. He quit smokeless tobacco use about 6 years ago. He reports that he drinks about 0.6 oz of alcohol per week. He reports that he does not use illicit drugs.  ROS: UROLOGY Frequent Urination?: No Hard to postpone urination?: No Burning/pain with urination?: No Get up at night to  urinate?: Yes Leakage of urine?: No Urine stream starts and stops?: No Trouble starting stream?: No Do you have to strain to urinate?: No Blood in urine?: No Urinary tract infection?: No Sexually transmitted disease?: No Injury to kidneys or bladder?: No Painful intercourse?: No Weak stream?: No Erection problems?: No Penile pain?: No  Gastrointestinal Nausea?: No Vomiting?: No Indigestion/heartburn?: Yes Diarrhea?: Yes Constipation?: Yes  Constitutional Fever: No Night sweats?: No Weight loss?: No Fatigue?: No  Skin Skin rash/lesions?: No Itching?: No  Eyes Blurred vision?: No Double vision?: No  Ears/Nose/Throat Sore throat?: No Sinus problems?: No  Hematologic/Lymphatic Swollen glands?: No Easy bruising?: No  Cardiovascular Leg swelling?: No Chest pain?: No  Respiratory Cough?:  No Shortness of breath?: No  Endocrine Excessive thirst?: No  Musculoskeletal Back pain?: No Joint pain?: No  Neurological Headaches?: Yes Dizziness?: No  Psychologic Depression?: Yes Anxiety?: No  Physical Exam: BP 148/88 mmHg  Pulse 75  Ht 6\' 2"  (1.88 m)  Wt 311 lb 12.8 oz (141.432 kg)  BMI 40.02 kg/m2  Constitutional: Well nourished. Alert and oriented, No acute distress. HEENT: [place] AT, moist mucus membranes. Trachea midline, no masses. Cardiovascular: No clubbing, cyanosis, or edema. Respiratory: Normal respiratory effort, no increased work of breathing. GI: Abdomen is soft, non tender, non distended, no abdominal masses. Liver and spleen not palpable.  No hernias appreciated.  Stool sample for occult testing is not indicated.   GU: No CVA tenderness.  No bladder fullness or masses.  Patient with uncircumcised phallus. Foreskin easily retracted  Urethral meatus is patent.  No penile discharge. No penile lesions or rashes. Scrotum without lesions, cysts, rashes and/or edema.  Testicles are located scrotally bilaterally. No masses are appreciated in the  testicles. Left and right epididymis are normal. Skin: No rashes, bruises or suspicious lesions. Lymph: No cervical or inguinal adenopathy. Neurologic: Grossly intact, no focal deficits, moving all 4 extremities. Psychiatric: Normal mood and affect.  Laboratory Data: Lab Results  Component Value Date   WBC 9.9 08/21/2014   HGB 14.7 08/21/2014   HCT 44.7 08/21/2014   MCV 84 08/21/2014   PLT 175 08/21/2014    Lab Results  Component Value Date   CREATININE 1.33 02/17/2015    Lab Results  Component Value Date   TESTOSTERONE 274.5* 04/18/2015   TESTOSTERONE 246* 04/18/2015    Lab Results  Component Value Date   TSH 3.17 02/17/2015    Lab Results  Component Value Date   AST 15 02/17/2015   Lab Results  Component Value Date   ALT 19 02/17/2015     Assessment & Plan:     1. Vasectomy consult:  Patient has read and signed the consent.  He is given the pre-op vasectomy instruction sheet.  He is prescribed Valium 10 mg and instructed to take it 30 minutes prior to his vasectomy appointment.  He is to have a driver.  I reemphasized to the patient that this is to be considered a permanent form of birth control, that he is to use an alternative form of birth control until we receive the 3 months specimen and it is cleared of sperm and that this will not prevent STI's.  His questions are answered to his satisfaction and he understands the risks and is willing to proceed with the vasectomy.  He will schedule his vasectomy.    I spent 25 minutes face to face with the patient discussing the vasectomy procedure, vasectomy risks, pre procedural instructions and post procedural instructions.  Greater than 50% was spent in counseling & coordination of care with the patient.  Return for schedule vasectomy.  These notes generated with voice recognition software. I apologize for typographical errors.  Zara Council, [facility] [address] [place], [place] [address] [phone]

## 2016-05-24 ENCOUNTER — Ambulatory Visit (INDEPENDENT_AMBULATORY_CARE_PROVIDER_SITE_OTHER): Payer: Managed Care, Other (non HMO) | Admitting: Urology

## 2016-05-24 VITALS — BP 172/102 | HR 93 | Ht 75.0 in | Wt 305.0 lb

## 2016-05-24 DIAGNOSIS — Z302 Encounter for sterilization: Secondary | ICD-10-CM

## 2016-05-24 NOTE — Progress Notes (Signed)
05/24/2016  CC: desires vasectomy  HPI: 35 yo M presents today for vasectomy.  He desires no further biological children.  Previously counselled extensively.  All question answered.    Blood pressure 172/102, pulse 93, height 6\' 3"  (1.905 m), weight 305 lb (138.347 kg). Physical Exam  Constitutional: He is oriented to person, place, and time. He appears well-developed and well-nourished.  HENT:  Head: Normocephalic and atraumatic.  Abdominal: Soft.  Genitourinary: Penis normal.  Normal descended testicles bilaterally, easily palpable vasa  Neurological: He is alert and oriented to person, place, and time.  Skin: Skin is warm and dry.   Bilateral Vasectomy Procedure  Pre-Procedure: - Patient's scrotum was prepped and draped for vasectomy. - The vas was palpated through the scrotal skin on the left. - 1% Xylocaine was injected into the skin and surrounding tissue for placement  - In a similar manner, the vas on the right was identified, anesthetized, and stabilized.  Procedure: - A bladeless technique was used to open the overlying skin (sharp dissection) - The left vas was isolated and brought up through the incision exposing that structure. - Bleeding points were cauterized as they occurred. - The vas was free from the surrounding structures and brought to the view. - A segment was positioned for placement with a hemostat. - A second hemostat was placed and a small segment between the two hemostats and was removed for inspection. - Each end of the transected vas lumen was fulgurated/ obliterated using needlepoint electrocautery -A fascial interposition was performed on testicular end of the vas using #3-0 chromic suture -The same procedure was performed on the right. - A single suture of #3-0 chromic catgut was used to close each lateral scrotal skin incision - A dressing was applied.  Post-Procedure: - Patient was instructed in care of the operative area - A specimen is to be  delivered in 12 weeks   -Another form of contraception is to be used until cleared  Hollice Espy, MD

## 2016-07-26 ENCOUNTER — Emergency Department
Admission: EM | Admit: 2016-07-26 | Discharge: 2016-07-26 | Disposition: A | Discharge status: Home / Self Care | Payer: Managed Care, Other (non HMO) | Attending: Emergency Medicine | Admitting: Emergency Medicine

## 2016-07-26 ENCOUNTER — Emergency Department: Payer: Managed Care, Other (non HMO)

## 2016-07-26 DIAGNOSIS — Z79899 Other long term (current) drug therapy: Secondary | ICD-10-CM | POA: Diagnosis not present

## 2016-07-26 DIAGNOSIS — Z87891 Personal history of nicotine dependence: Secondary | ICD-10-CM | POA: Insufficient documentation

## 2016-07-26 DIAGNOSIS — J40 Bronchitis, not specified as acute or chronic: Secondary | ICD-10-CM | POA: Insufficient documentation

## 2016-07-26 DIAGNOSIS — Z85828 Personal history of other malignant neoplasm of skin: Secondary | ICD-10-CM | POA: Diagnosis not present

## 2016-07-26 DIAGNOSIS — I10 Essential (primary) hypertension: Secondary | ICD-10-CM | POA: Insufficient documentation

## 2016-07-26 DIAGNOSIS — R05 Cough: Secondary | ICD-10-CM | POA: Diagnosis present

## 2016-07-26 MED ORDER — AZITHROMYCIN 500 MG PO TABS
500.0000 mg | ORAL_TABLET | Freq: Every day | ORAL | Status: DC
  Administered 2016-07-26: 500 mg via ORAL

## 2016-07-26 MED ORDER — PHENYLEPHRINE-CHLORPHEN-DM 3.5-1-3 MG/ML PO LIQD: 1.0000 mL | Freq: Four times a day (QID) | ORAL | 0 refills | Status: AC | PRN

## 2016-07-26 MED ORDER — PREDNISONE 20 MG PO TABS
60.0000 mg | ORAL_TABLET | Freq: Once | ORAL | Status: AC
  Administered 2016-07-26: 60 mg via ORAL
  Filled 2016-07-26: qty 3

## 2016-07-26 MED ORDER — AZITHROMYCIN 500 MG PO TABS
ORAL_TABLET | ORAL | Status: AC
  Administered 2016-07-26: 500 mg via ORAL
  Filled 2016-07-26: qty 1

## 2016-07-26 MED ORDER — PREDNISONE 20 MG PO TABS: 60.0000 mg | ORAL_TABLET | Freq: Every day | ORAL | 0 refills | Status: AC

## 2016-07-26 MED ORDER — HYDROCOD POLST-CPM POLST ER 10-8 MG/5ML PO SUER
10.0000 mL | Freq: Once | ORAL | Status: AC
Start: 2016-07-26 — End: 2016-07-26
  Administered 2016-07-26: 10 mL via ORAL
  Filled 2016-07-26: qty 10

## 2016-07-26 MED ORDER — AZITHROMYCIN 500 MG PO TABS: 500.0000 mg | ORAL_TABLET | Freq: Every day | ORAL | 0 refills | Status: AC

## 2016-07-26 NOTE — ED Triage Notes (Addendum)
Patient ambulatory to triage with steady gait, without difficulty or distress noted; pt reports prod cough x month yellow sputum; rx prednisone last week but cough persists; today has had post-tussive emesis; denies nausea

## 2016-07-26 NOTE — ED Provider Notes (Signed)
[facility] Emergency Department Provider Note  ____________________________________________   First MD Initiated Contact with Patient 07/26/16 [phone]     (approximate)  I have reviewed the triage vital signs and the nursing notes.   HISTORY  Chief Complaint Cough   HPI David Wheeler is a 35 y.o. male presents with productive cough times one month with yellow sputum. Patient states she was seen by primary care provider prescribed prednisone 60 a taper without any improvement. Patient states that he's had posttussive emesis tonight. Patient denies any fever afebrile on presentation. Patient admits to tobacco smoking in the past however quit years ago   Past Medical History:  Diagnosis Date  . Adopted   . Alcohol use ([place])   . Allergy   . Bipolar affective ([place])   . Cancer ([place])    skin cancer  . Complication of anesthesia    slow to wake after back surgery  . Depression   . GERD (gastroesophageal reflux disease)   . Headache    migraines - 1/month  . Hypertension   . Hypogonadism in male   . Nephrolithiasis   . Right-sided Bell's palsy 2015   resolved  . Sleep apnea    has CPAP, doesn't use  . Substance abuse   . Testicular pain    left    Patient Active Problem List   Diagnosis Date Noted  . Vasectomy evaluation 04/16/2016  . Degeneration of intervertebral disc of lumbosacral region 02/06/2015  . Disc disease with myelopathy, lumbar 02/06/2015  . Alimentary obesity 02/06/2015  . Neuralgia neuritis, sciatic nerve 02/06/2015  . Gastro-esophageal reflux disease without esophagitis 01/31/2015  . Obstructive apnea 01/31/2015  . Low serum testosterone level 01/18/2015  . Bipolar disease, chronic ([place]) 01/18/2015  . Lumbar radiculitis 01/18/2015  . Essential hypertension 01/18/2015  . Migraines 01/18/2015  . Bipolar affective disorder ([place]) 01/18/2015  . Essential (primary) hypertension 01/18/2015  . Headache, migraine 01/18/2015    . Testicular hypofunction 01/18/2015  . Bulge of lumbar disc without myelopathy 10/18/2014  . Bell palsy 08/16/2014  . Accelerated hypertension 04/14/2014  . Lithium use 04/14/2014  . Hypotestosteronism 04/14/2014  . Bipolar I disorder ([place]) 04/13/2014  . BP (high blood pressure) 04/13/2014    Past Surgical History:  Procedure Laterality Date  . BACK SURGERY  Feb. 2016  . CHOLECYSTECTOMY    . COLONOSCOPY N/A 09/13/2015   Procedure: COLONOSCOPY;  Surgeon: Hulen Luster, MD;  Location: [facility];  Service: Gastroenterology;  Laterality: N/A;  CPAP Latex sensitivity  . TONSILLECTOMY AND ADENOIDECTOMY  2002    Prior to Admission medications   Medication Sig Start Date End Date Taking? Authorizing Provider  bisoprolol-hydrochlorothiazide (ZIAC) 5-6.25 MG per tablet Take 1 tablet by mouth daily. AM 04/14/14 04/14/15  Historical Provider, MD  butalbital-acetaminophen-caffeine (FIORICET, ESGIC) 50-325-40 MG per tablet Take 1 tablet by mouth every 4 (four) hours as needed.    Historical Provider, MD  diazepam (VALIUM) 10 MG tablet Take 30 minutes prior to the vasectomy 04/16/16   Larene Beach A McGowan, PA-C  diphenoxylate-atropine (LOMOTIL) 2.5-0.025 MG tablet Take by mouth 4 (four) times daily as needed for diarrhea or loose stools.    Historical Provider, MD  Eluxadoline (VIBERZI) 100 MG TABS Take by mouth. 10/03/15   Historical Provider, MD  FLUoxetine (PROZAC) 40 MG capsule Take 2 capsules by mouth daily. PM    Historical Provider, MD  gabapentin (NEURONTIN) 300 MG capsule Reported on 04/16/2016 01/17/15   Historical Provider, MD  HYDROcodone-acetaminophen (NORCO) 10-325 MG tablet Take 1 tablet by mouth every 6 (six) hours as needed. Reported on 04/16/2016    Historical Provider, MD  lithium carbonate 300 MG capsule TAKE 2 CAPSULES (600 MG TOTAL) BY MOUTH 2 (TWO) TIMES DAILY. 06/20/14   Historical Provider, MD  omeprazole (PRILOSEC) 40 MG capsule Take 40 mg by mouth daily.    Historical Provider, MD   testosterone cypionate (DEPOTESTOTERONE CYPIONATE) 100 MG/ML injection Inject 200 mg into the muscle every 14 (fourteen) days. For IM use only    Historical Provider, MD    Allergies Contrast media [iodinated diagnostic agents]; Shellfish allergy; and Latex  No family history on file.  Social History Social History  Substance Use Topics  . Smoking status: Former Research scientist (life sciences)  . Smokeless tobacco: Former Systems developer    Quit date: 01/09/2010  . Alcohol use 0.6 oz/week    1 Cans of beer per week    Review of Systems Constitutional: No fever/chills Eyes: No visual changes. ENT: No sore throat. Cardiovascular: Denies chest pain. Respiratory: Denies shortness of breath. Positive for cough Gastrointestinal: No abdominal pain.  No nausea, no vomiting.  No diarrhea.  No constipation. Genitourinary: Negative for dysuria. Musculoskeletal: Negative for back pain. Skin: Negative for rash. Neurological: Negative for headaches, focal weakness or numbness.  10-point ROS otherwise negative.  ____________________________________________   PHYSICAL EXAM:  VITAL SIGNS: ED Triage Vitals [07/26/16 0206]  Enc Vitals Group     BP (!) 151/92     Pulse Rate 94     Resp 20     Temp 97.5 F (36.4 C)     Temp Source Oral     SpO2 95 %     Weight 290 lb (131.5 kg)     Height 6\' 3"  (1.905 m)     Head Circumference      Peak Flow      Pain Score      Pain Loc      Pain Edu?      Excl. in [place]?     Constitutional: Alert and oriented. Well appearing and in no acute distress. Eyes: Conjunctivae are normal. PERRL. EOMI. Head: Atraumatic. Mouth/Throat: Mucous membranes are moist.  Oropharynx non-erythematous. Neck: No stridor.  No meningeal signs.   Cardiovascular: Normal rate, regular rhythm. Good peripheral circulation. Grossly normal heart sounds.   Respiratory: Normal respiratory effort.  No retractions. Lungs CTAB. Gastrointestinal: Soft and nontender. No distention.  Musculoskeletal: No lower  extremity tenderness nor edema. No gross deformities of extremities. Neurologic:  Normal speech and language. No gross focal neurologic deficits are appreciated.  Skin:  Skin is warm, dry and intact. No rash noted. Psychiatric: Mood and affect are normal. Speech and behavior are normal.   Labs Reviewed - No data to display   RADIOLOGY I, [place], personally viewed and evaluated these images (plain radiographs) as part of my medical decision making, as well as reviewing the written report by the radiologist.  Dg Chest 2 View  Result Date: 07/26/2016 CLINICAL DATA:  Productive cough for 1 month. Cough persisting on medication. History of hypertension. EXAM: CHEST  2 VIEW COMPARISON:  09/01/2011 FINDINGS: The heart size and mediastinal contours are within normal limits. Both lungs are clear. The visualized skeletal structures are unremarkable. IMPRESSION: No active cardiopulmonary disease. Electronically Signed   By: Lucienne Capers M.D.   On: 07/26/2016 02:27     Procedures     INITIAL IMPRESSION / ASSESSMENT AND PLAN / ED COURSE  Pertinent  labs & imaging results that were available during my care of the patient were reviewed by me and considered in my medical decision making (see chart for details).  Patient given azithromycin due to 2 concerns namely possibility of pertussis or chronic bronchitis. Will prescribe 5 day course of prednisone as well as azithromycin.  Clinical Course    ____________________________________________  FINAL CLINICAL IMPRESSION(S) / ED DIAGNOSES  Final diagnoses:  Bronchitis     MEDICATIONS GIVEN DURING THIS VISIT:  Medications  azithromycin (ZITHROMAX) tablet 500 mg (500 mg Oral Given 07/26/16 0328)  chlorpheniramine-HYDROcodone (TUSSIONEX) 10-8 MG/5ML suspension 10 mL (10 mLs Oral Given 07/26/16 0327)  predniSONE (DELTASONE) tablet 60 mg (60 mg Oral Given 07/26/16 0327)     NEW OUTPATIENT MEDICATIONS STARTED DURING THIS  VISIT:  New Prescriptions   No medications on file      Note:  This document was prepared using Dragon voice recognition software and may include unintentional dictation errors.    Gregor Hams, MD 07/26/16 [phone]

## 2016-09-03 ENCOUNTER — Other Ambulatory Visit: Payer: Managed Care, Other (non HMO)

## 2016-09-03 DIAGNOSIS — Z9852 Vasectomy status: Secondary | ICD-10-CM

## 2016-09-06 ENCOUNTER — Telehealth: Payer: Self-pay

## 2016-09-06 LAB — POST-VAS SPERM EVALUATION,QUAL: Volume: 1.9 mL

## 2016-09-06 NOTE — Telephone Encounter (Signed)
LMOM

## 2016-09-06 NOTE — Telephone Encounter (Signed)
-----   Message from Hollice Espy, MD sent at 09/06/2016  8:09 AM EDT ----- No sperms, good to go.    Hollice Espy, MD

## 2016-09-09 NOTE — Telephone Encounter (Signed)
LMOM

## 2016-09-10 NOTE — Telephone Encounter (Signed)
LMOM- will send a letter.  

## 2016-12-12 ENCOUNTER — Ambulatory Visit: Payer: Managed Care, Other (non HMO) | Admitting: Urology

## 2016-12-12 ENCOUNTER — Encounter: Payer: Self-pay | Admitting: Urology

## 2016-12-12 VITALS — BP 151/91 | HR 82 | Ht 75.0 in | Wt 305.7 lb

## 2016-12-12 DIAGNOSIS — N5089 Other specified disorders of the male genital organs: Secondary | ICD-10-CM | POA: Diagnosis not present

## 2016-12-12 NOTE — Progress Notes (Signed)
12/12/2016 11:35 AM   David Wheeler 1981/10/09 [id number]  Referring provider: Idelle Crouch, MD [place] [facility] [place], [place] [address]  Chief Complaint  Patient presents with  . Groin Swelling    left side    HPI: The patient is a 36 year old male with a past medical history a vasectomy in June 2017 who presents today with left testicular pain. This has been going on for approximate 1-2 weeks. He feels that then a nodule above his left testicle. He has been told before that his sperm granuloma here. It is not in his actual testicle. He has no dysuria. He describes it as being kicked in that area. He denies any recent trauma there. This has happened once before and it resolved spontaneously.  Urinalysis was clean.   PMH: Past Medical History:  Diagnosis Date  . Adopted   . Alcohol use   . Allergy   . Bipolar affective ([place])   . Cancer ([place])    skin cancer  . Complication of anesthesia    slow to wake after back surgery  . Depression   . GERD (gastroesophageal reflux disease)   . Headache    migraines - 1/month  . Hypertension   . Hypogonadism in male   . Nephrolithiasis   . Right-sided Bell's palsy 2015   resolved  . Sleep apnea    has CPAP, doesn't use  . Substance abuse   . Testicular pain    left    Surgical History: Past Surgical History:  Procedure Laterality Date  . BACK SURGERY  Feb. 2016  . CHOLECYSTECTOMY    . COLONOSCOPY N/A 09/13/2015   Procedure: COLONOSCOPY;  Surgeon: Hulen Luster, MD;  Location: [place];  Service: Gastroenterology;  Laterality: N/A;  CPAP Latex sensitivity  . TONSILLECTOMY AND ADENOIDECTOMY  2002    Home Medications:  Allergies as of 12/12/2016      Reactions   Contrast Media [iodinated Diagnostic Agents] Anaphylaxis   Shellfish Allergy Anaphylaxis   Latex Rash   Only when gloves are worn by pt.      Medication List       Accurate as of 12/12/16 11:35 AM. Always use your  most recent med list.          bisoprolol-hydrochlorothiazide 5-6.25 MG tablet Commonly known as:  ZIAC Take 1 tablet by mouth daily. AM   butalbital-acetaminophen-caffeine 50-325-40 MG tablet Commonly known as:  FIORICET, ESGIC Take 1 tablet by mouth every 4 (four) hours as needed.   chlorpheniramine-phenylephrine-dextromethorphan 3.5-1-3 MG/ML solution Commonly known as:  CARDEC DM Take 1 mL by mouth every 6 (six) hours as needed for cough.   diazepam 10 MG tablet Commonly known as:  VALIUM Take 30 minutes prior to the vasectomy   diphenoxylate-atropine 2.5-0.025 MG tablet Commonly known as:  LOMOTIL Take by mouth 4 (four) times daily as needed for diarrhea or loose stools.   FLUoxetine 40 MG capsule Commonly known as:  PROZAC Take 2 capsules by mouth daily. PM   gabapentin 300 MG capsule Commonly known as:  NEURONTIN Reported on 04/16/2016   HYDROcodone-acetaminophen 10-325 MG tablet Commonly known as:  NORCO Take 1 tablet by mouth every 6 (six) hours as needed. Reported on 04/16/2016   lithium carbonate 300 MG capsule TAKE 2 CAPSULES (600 MG TOTAL) BY MOUTH 2 (TWO) TIMES DAILY.   omeprazole 40 MG capsule Commonly known as:  PRILOSEC Take 40 mg by mouth daily.   testosterone cypionate  100 MG/ML injection Commonly known as:  DEPOTESTOTERONE CYPIONATE Inject 200 mg into the muscle every 14 (fourteen) days. For IM use only   VIBERZI 100 MG Tabs Generic drug:  Eluxadoline Take by mouth.       Allergies:  Allergies  Allergen Reactions  . Contrast Media [Iodinated Diagnostic Agents] Anaphylaxis  . Shellfish Allergy Anaphylaxis  . Latex Rash    Only when gloves are worn by pt.    Family History: History reviewed. No pertinent family history.  Social History:  reports that he has quit smoking. He quit smokeless tobacco use about 6 years ago. He reports that he drinks about 0.6 oz of alcohol per week . He reports that he does not use  drugs.  ROS: UROLOGY Frequent Urination?: No Hard to postpone urination?: No Burning/pain with urination?: No Get up at night to urinate?: No Leakage of urine?: No Urine stream starts and stops?: No Trouble starting stream?: No Do you have to strain to urinate?: No Blood in urine?: No Urinary tract infection?: No Sexually transmitted disease?: No Injury to kidneys or bladder?: No Painful intercourse?: No Weak stream?: No Erection problems?: No Penile pain?: Yes  Gastrointestinal Nausea?: No Vomiting?: No Indigestion/heartburn?: No Diarrhea?: No Constipation?: No  Constitutional Fever: No Night sweats?: No Weight loss?: No Fatigue?: No  Skin Skin rash/lesions?: No Itching?: No  Eyes Blurred vision?: No Double vision?: No  Ears/Nose/Throat Sore throat?: No Sinus problems?: No  Hematologic/Lymphatic Swollen glands?: No Easy bruising?: No  Cardiovascular Leg swelling?: No Chest pain?: No  Respiratory Cough?: No Shortness of breath?: No  Endocrine Excessive thirst?: No  Musculoskeletal Back pain?: Yes Joint pain?: No  Neurological Headaches?: Yes Dizziness?: No  Psychologic Depression?: Yes Anxiety?: No  Physical Exam: BP (!) 151/91 (BP Location: Left Arm, Patient Position: Sitting, Cuff Size: Large)   Pulse 82   Ht 6\' 3"  (1.905 m)   Wt (!) 305 lb 11.2 oz (138.7 kg)   BMI 38.21 kg/m   Constitutional:  Alert and oriented, No acute distress. HEENT: [place] AT, moist mucus membranes.  Trachea midline, no masses. Cardiovascular: No clubbing, cyanosis, or edema. Respiratory: Normal respiratory effort, no increased work of breathing. GI: Abdomen is soft, nontender, nondistended, no abdominal masses GU: No CVA tenderness. Right testicle normal. Left testicle nontender to palpation. He has a likely sperm granuloma just superior to his testicle though it could be an epididymal cyst as well as is difficult to tell. This is tender to palpation. He has  no tenderness no spermatic cord or left testicle. Skin: No rashes, bruises or suspicious lesions. Lymph: No cervical or inguinal adenopathy. Neurologic: Grossly intact, no focal deficits, moving all 4 extremities. Psychiatric: Normal mood and affect.  Laboratory Data: Lab Results  Component Value Date   WBC 9.9 08/21/2014   HGB 14.7 08/21/2014   HCT 44.7 08/21/2014   MCV 84 08/21/2014   PLT 175 08/21/2014    Lab Results  Component Value Date   CREATININE 1.33 02/17/2015    No results found for: PSA  Lab Results  Component Value Date   TESTOSTERONE 274.5 (L) 04/18/2015   TESTOSTERONE 246 (L) 04/18/2015    No results found for: HGBA1C  Urinalysis    Component Value Date/Time   COLORURINE AMBER 02/27/2013 [place] 02/27/2013 1529   LABSPEC 1.025 02/27/2013 1529   PHURINE 5.0 02/27/2013 1529   GLUCOSEU NEGATIVE 02/27/2013 1529   HGBUR NEGATIVE 02/27/2013 1529   BILIRUBINUR 1+ 02/27/2013 1529  KETONESUR NEGATIVE 02/27/2013 1529   PROTEINUR TRACE 02/27/2013 1529   NITRITE NEGATIVE 02/27/2013 1529   LEUKOCYTESUR NEGATIVE 02/27/2013 1529    Assessment & Plan:    1. Left sperm granuloma This is the likely source of his pain as it is tender to palpation. A lid for him to treat this conservatively at this time with icing his scrotum at least once per day but more if possible. He'll also take Advil on a scheduled basis. If this continues to cause discomfort in 1 month's time, he will follow-up to discuss a possible excision of sperm granuloma.  Return in about 4 weeks (around 01/09/2017).  Nickie Retort, MD  [facility] [address], [place], [place] [address] [phone]

## 2017-01-15 ENCOUNTER — Encounter: Payer: Self-pay | Admitting: Urology

## 2017-01-15 ENCOUNTER — Ambulatory Visit: Payer: Managed Care, Other (non HMO) | Admitting: Urology

## 2017-04-30 ENCOUNTER — Ambulatory Visit: Payer: Self-pay | Admitting: Physician Assistant

## 2018-09-02 ENCOUNTER — Other Ambulatory Visit
Admission: RE | Admit: 2018-09-02 | Discharge: 2018-09-02 | Disposition: A | Discharge status: Home / Self Care | Payer: No Typology Code available for payment source | Attending: Family Medicine | Admitting: Family Medicine

## 2018-09-02 NOTE — ED Notes (Signed)
UDS WC preformed and walked down to the lab by this tech. Urine placed in refrigerator at 1712 and documented on sheet in lab. Pt given his copy of paperwork and given copy for employer as well. Employee representative in with pt.

## 2019-10-14 ENCOUNTER — Other Ambulatory Visit
Admission: RE | Admit: 2019-10-14 | Discharge: 2019-10-14 | Disposition: A | Discharge status: Home / Self Care | Payer: Managed Care, Other (non HMO) | Source: Ambulatory Visit | Attending: Internal Medicine | Admitting: Internal Medicine

## 2019-10-14 ENCOUNTER — Other Ambulatory Visit: Payer: Self-pay

## 2019-10-14 DIAGNOSIS — Z01812 Encounter for preprocedural laboratory examination: Secondary | ICD-10-CM | POA: Diagnosis present

## 2019-10-14 DIAGNOSIS — Z20828 Contact with and (suspected) exposure to other viral communicable diseases: Secondary | ICD-10-CM | POA: Insufficient documentation

## 2019-10-14 LAB — SARS CORONAVIRUS 2 (TAT 6-24 HRS): SARS Coronavirus 2: NEGATIVE

## 2019-10-15 ENCOUNTER — Encounter: Payer: Self-pay | Admitting: *Deleted

## 2019-10-18 ENCOUNTER — Ambulatory Visit
Admission: RE | Admit: 2019-10-18 | Discharge: 2019-10-18 | Disposition: A | Discharge status: Home / Self Care | Payer: Managed Care, Other (non HMO) | Attending: Internal Medicine | Admitting: Internal Medicine

## 2019-10-18 ENCOUNTER — Ambulatory Visit: Payer: Managed Care, Other (non HMO) | Admitting: Anesthesiology

## 2019-10-18 ENCOUNTER — Encounter: Payer: Self-pay | Admitting: *Deleted

## 2019-10-18 ENCOUNTER — Encounter
Admission: RE | Disposition: A | Discharge status: Home / Self Care | Payer: Self-pay | Source: Home / Self Care | Attending: Internal Medicine

## 2019-10-18 ENCOUNTER — Ambulatory Visit: Admit: 2019-10-18 | Payer: Self-pay | Admitting: Internal Medicine

## 2019-10-18 ENCOUNTER — Other Ambulatory Visit: Payer: Self-pay

## 2019-10-18 DIAGNOSIS — Z7951 Long term (current) use of inhaled steroids: Secondary | ICD-10-CM | POA: Diagnosis not present

## 2019-10-18 DIAGNOSIS — Z6839 Body mass index (BMI) 39.0-39.9, adult: Secondary | ICD-10-CM | POA: Diagnosis not present

## 2019-10-18 DIAGNOSIS — K2100 Gastro-esophageal reflux disease with esophagitis, without bleeding: Secondary | ICD-10-CM | POA: Insufficient documentation

## 2019-10-18 DIAGNOSIS — Z79899 Other long term (current) drug therapy: Secondary | ICD-10-CM | POA: Insufficient documentation

## 2019-10-18 DIAGNOSIS — F319 Bipolar disorder, unspecified: Secondary | ICD-10-CM | POA: Insufficient documentation

## 2019-10-18 DIAGNOSIS — E669 Obesity, unspecified: Secondary | ICD-10-CM | POA: Diagnosis not present

## 2019-10-18 DIAGNOSIS — G43909 Migraine, unspecified, not intractable, without status migrainosus: Secondary | ICD-10-CM | POA: Insufficient documentation

## 2019-10-18 DIAGNOSIS — K228 Other specified diseases of esophagus: Secondary | ICD-10-CM | POA: Insufficient documentation

## 2019-10-18 DIAGNOSIS — F419 Anxiety disorder, unspecified: Secondary | ICD-10-CM | POA: Insufficient documentation

## 2019-10-18 DIAGNOSIS — G4733 Obstructive sleep apnea (adult) (pediatric): Secondary | ICD-10-CM | POA: Insufficient documentation

## 2019-10-18 DIAGNOSIS — M1990 Unspecified osteoarthritis, unspecified site: Secondary | ICD-10-CM | POA: Insufficient documentation

## 2019-10-18 DIAGNOSIS — R112 Nausea with vomiting, unspecified: Secondary | ICD-10-CM | POA: Insufficient documentation

## 2019-10-18 DIAGNOSIS — I10 Essential (primary) hypertension: Secondary | ICD-10-CM | POA: Insufficient documentation

## 2019-10-18 DIAGNOSIS — K2970 Gastritis, unspecified, without bleeding: Secondary | ICD-10-CM | POA: Diagnosis not present

## 2019-10-18 HISTORY — DX: Bell's palsy: G51.0

## 2019-10-18 HISTORY — PX: ESOPHAGOGASTRODUODENOSCOPY (EGD) WITH PROPOFOL: SHX5813

## 2019-10-18 HISTORY — DX: Personal history of urinary calculi: Z87.442

## 2019-10-18 HISTORY — DX: Anxiety disorder, unspecified: F41.9

## 2019-10-18 HISTORY — DX: Unspecified osteoarthritis, unspecified site: M19.90

## 2019-10-18 SURGERY — ESOPHAGOGASTRODUODENOSCOPY (EGD) WITH PROPOFOL
Anesthesia: General

## 2019-10-18 MED ORDER — PROPOFOL 500 MG/50ML IV EMUL
INTRAVENOUS | Status: DC | PRN
  Administered 2019-10-18: 75 ug/kg/min via INTRAVENOUS

## 2019-10-18 MED ORDER — LIDOCAINE HCL (PF) 2 % IJ SOLN
INTRAMUSCULAR | Status: DC | PRN
  Administered 2019-10-18: 100 mg

## 2019-10-18 MED ORDER — MIDAZOLAM HCL 2 MG/2ML IJ SOLN
INTRAMUSCULAR | Status: AC
  Filled 2019-10-18: qty 2

## 2019-10-18 MED ORDER — FENTANYL CITRATE (PF) 100 MCG/2ML IJ SOLN
INTRAMUSCULAR | Status: AC
  Filled 2019-10-18: qty 2

## 2019-10-18 MED ORDER — MIDAZOLAM HCL 5 MG/5ML IJ SOLN
INTRAMUSCULAR | Status: DC | PRN
  Administered 2019-10-18: 2 mg via INTRAVENOUS

## 2019-10-18 MED ORDER — SODIUM CHLORIDE 0.9 % IV SOLN
INTRAVENOUS | Status: DC
  Administered 2019-10-18: 13:00:00 via INTRAVENOUS

## 2019-10-18 MED ORDER — FENTANYL CITRATE (PF) 100 MCG/2ML IJ SOLN
INTRAMUSCULAR | Status: DC | PRN
  Administered 2019-10-18: 100 ug via INTRAVENOUS

## 2019-10-18 MED ORDER — PROPOFOL 10 MG/ML IV BOLUS
INTRAVENOUS | Status: DC | PRN
  Administered 2019-10-18: 20 mg via INTRAVENOUS
  Administered 2019-10-18: 30 mg via INTRAVENOUS

## 2019-10-18 NOTE — Transfer of Care (Signed)
Immediate Anesthesia Transfer of Care Note  Patient: David Wheeler  Procedure(s) Performed: ESOPHAGOGASTRODUODENOSCOPY (EGD) WITH PROPOFOL (N/A )  Patient Location: PACU  Anesthesia Type:General  Level of Consciousness: sedated  Airway & Oxygen Therapy: Patient Spontanous Breathing and Patient connected to nasal cannula oxygen  Post-op Assessment: Report given to RN and Post -op Vital signs reviewed and stable  Post vital signs: Reviewed and stable  Last Vitals:  Vitals Value Taken Time  BP 148/102 10/18/19 1338  Temp 36.5 C 10/18/19 1338  Pulse 87 10/18/19 1339  Resp 18 10/18/19 1339  SpO2 97 % 10/18/19 1339  Vitals shown include unvalidated device data.  Last Pain:  Vitals:   10/18/19 1338  TempSrc: Temporal  PainSc: 0-No pain         Complications: No apparent anesthesia complications

## 2019-10-18 NOTE — Anesthesia Preprocedure Evaluation (Signed)
Anesthesia Evaluation  Patient identified by MRN, date of birth, ID band Patient awake    Reviewed: Allergy & Precautions, H&P , NPO status , Patient's Chart, lab work & pertinent test results  History of Anesthesia Complications (+) PROLONGED EMERGENCE and history of anesthetic complications  Airway Mallampati: III  TM Distance: >3 FB Neck ROM: full    Dental  (+) Chipped   Pulmonary neg shortness of breath, sleep apnea and Continuous Positive Airway Pressure Ventilation , Current Smoker and Patient abstained from smoking.,           Cardiovascular Exercise Tolerance: Good hypertension, (-) angina(-) Past MI and (-) DOE      Neuro/Psych  Headaches, PSYCHIATRIC DISORDERS  Neuromuscular disease    GI/Hepatic Neg liver ROS, GERD  Medicated and Controlled,  Endo/Other  negative endocrine ROS  Renal/GU Renal disease  negative genitourinary   Musculoskeletal  (+) Arthritis ,   Abdominal   Peds  Hematology negative hematology ROS (+)   Anesthesia Other Findings Past Medical History: No date: Adopted No date: Alcohol use No date: Allergy No date: Anxiety No date: Arthritis No date: Bell's palsy No date: Bipolar affective ([facility]) No date: Cancer ([place])     Comment:  skin cancer No date: Complication of anesthesia     Comment:  slow to wake after back surgery No date: Depression No date: GERD (gastroesophageal reflux disease) No date: Headache     Comment:  migraines - 1/month No date: History of kidney stones No date: Hypertension No date: Hypogonadism in male No date: Nephrolithiasis 2015: Right-sided Bell's palsy     Comment:  resolved No date: Sleep apnea     Comment:  has CPAP, doesn't use No date: Substance abuse ([place]) No date: Testicular pain     Comment:  left  Past Surgical History: Feb. 2016: BACK SURGERY No date: CHOLECYSTECTOMY 09/13/2015: COLONOSCOPY; N/A     Comment:  Procedure: COLONOSCOPY;   Surgeon: Hulen Luster, MD;                Location: [place];  Service:               Gastroenterology;  Laterality: N/A;  CPAP Latex               sensitivity 2002: TONSILLECTOMY AND ADENOIDECTOMY  BMI    Body Mass Index: 39.16 kg/m      Reproductive/Obstetrics negative OB ROS                             Anesthesia Physical Anesthesia Plan  ASA: III  Anesthesia Plan: General   Post-op Pain Management:    Induction: Intravenous  PONV Risk Score and Plan: Propofol infusion and TIVA  Airway Management Planned: Natural Airway and Nasal Cannula  Additional Equipment:   Intra-op Plan:   Post-operative Plan:   Informed Consent: I have reviewed the patients History and Physical, chart, labs and discussed the procedure including the risks, benefits and alternatives for the proposed anesthesia with the patient or authorized representative who has indicated his/her understanding and acceptance.     Dental Advisory Given  Plan Discussed with: Anesthesiologist, CRNA and Surgeon  Anesthesia Plan Comments: (Patient consented for risks of anesthesia including but not limited to:  - adverse reactions to medications - risk of intubation if required - damage to teeth, lips or other oral mucosa - sore throat or hoarseness - Damage to heart, brain, lungs  or loss of life  Patient voiced understanding.)        Anesthesia Quick Evaluation

## 2019-10-18 NOTE — Anesthesia Post-op Follow-up Note (Signed)
Anesthesia QCDR form completed.        

## 2019-10-18 NOTE — Op Note (Signed)
[facility] Gastroenterology Patient Name: Fenner Probus Procedure Date: 10/18/2019 1:23 PM MRN: [id number] Account #: [id number] Date of Birth: Dec 23, 1980 Admit Type: Outpatient Age: 38 Room: [facility] ENDO ROOM 3 Gender: Male Note Status: Finalized Procedure:             Upper GI endoscopy Indications:           Suspected esophageal reflux, Nausea with vomiting Providers:             Benay Pike. Alice Reichert MD, MD Referring MD:          Leonie Douglas. Doy Hutching, MD (Referring MD) Medicines:             Propofol per Anesthesia Complications:         No immediate complications. Procedure:             Pre-Anesthesia Assessment:                        - The risks and benefits of the procedure and the                         sedation options and risks were discussed with the                         patient. All questions were answered and informed                         consent was obtained.                        - Patient identification and proposed procedure were                         verified prior to the procedure by the nurse. The                         procedure was verified in the procedure room.                        - ASA Grade Assessment: III - A patient with severe                         systemic disease.                        - After reviewing the risks and benefits, the patient                         was deemed in satisfactory condition to undergo the                         procedure.                        After obtaining informed consent, the endoscope was                         passed under direct vision. Throughout the procedure,                         the  patient's blood pressure, pulse, and oxygen                         saturations were monitored continuously. The Endoscope                         was introduced through the mouth, and advanced to the                         third part of duodenum. The upper GI endoscopy was   accomplished without difficulty. The patient tolerated                         the procedure well. Findings:      Diffuse mild mucosal changes characterized by feline appearance were       found in the entire esophagus. Biopsies were obtained from the proximal       and distal esophagus with cold forceps for histology of suspected       eosinophilic esophagitis.      Patchy minimal inflammation characterized by erythema was found in the       entire examined stomach. Biopsies were taken with a cold forceps for       histology.      The examined duodenum was normal.      There is no endoscopic evidence of extrinsic compression, stenosis or       stricture in the stomach. Impression:            - Feline appearance mucosa in the esophagus. Biopsied.                        - Gastritis. Biopsied.                        - Normal examined duodenum. Recommendation:        - Patient has a contact number available for                         emergencies. The signs and symptoms of potential                         delayed complications were discussed with the patient.                         Return to normal activities tomorrow. Written                         discharge instructions were provided to the patient.                        - Resume previous diet.                        - Continue present medications.                        - Await pathology results.                        - Follow up with Octavia Bruckner, PA-C in 1 months.                        -  The findings and recommendations were discussed with                         the patient. Procedure Code(s):     --- Professional ---                        [id number], Esophagogastroduodenoscopy, flexible,                         transoral; with biopsy, single or multiple Diagnosis Code(s):     --- Professional ---                        R11.2, Nausea with vomiting, unspecified                        K29.70, Gastritis, unspecified, without  bleeding CPT copyright 2019 American Medical Association. All rights reserved. The codes documented in this report are preliminary and upon coder review may  be revised to meet current compliance requirements. Efrain Sella MD, MD 10/18/2019 1:37:22 PM This report has been signed electronically. Number of Addenda: 0 Note Initiated On: 10/18/2019 1:23 PM Estimated Blood Loss:  Estimated blood loss: none.      [facility]

## 2019-10-18 NOTE — Interval H&P Note (Signed)
History and Physical Interval Note:  10/18/2019 12:37 PM  David Wheeler  has presented today for surgery, with the diagnosis of NAUSEA, VOMITING.  The various methods of treatment have been discussed with the patient and family. After consideration of risks, benefits and other options for treatment, the patient has consented to  Procedure(s): ESOPHAGOGASTRODUODENOSCOPY (EGD) WITH PROPOFOL (N/A) as a surgical intervention.  The patient's history has been reviewed, patient examined, no change in status, stable for surgery.  I have reviewed the patient's chart and labs.  Questions were answered to the patient's satisfaction.     [place], [place]

## 2019-10-18 NOTE — H&P (Signed)
Outpatient short stay form Pre-procedure 10/18/2019 12:36 PM  K. Alice Reichert, M.D.  Primary Physician: Fulton Reek, M.D.  Reason for visit:  Nausea, vomiting, GERD  History of present illness:  Patient with 4 months of intermittent post-prandial nausea and vomiting without abdominal pain or dysphagia.Takes PPI for GERD symptoms which appears to work.    No current facility-administered medications for this encounter.   Medications Prior to Admission  Medication Sig Dispense Refill Last Dose  . budesonide (PULMICORT) 180 MCG/ACT inhaler Inhale 1 puff into the lungs daily.     Marland Kitchen losartan (COZAAR) 50 MG tablet Take 50 mg by mouth 2 (two) times daily.     . pantoprazole (PROTONIX) 40 MG tablet Take 40 mg by mouth daily.     . risperiDONE (RISPERDAL) 0.5 MG tablet Take 0.5 mg by mouth at bedtime.     . topiramate (TOPAMAX) 100 MG tablet Take 100 mg by mouth daily.     . Triamcinolone Acetonide (NASACORT AQ NA) Place 1 spray into the nose 2 (two) times daily.     . verapamil (CALAN-SR) 180 MG CR tablet Take 180 mg by mouth at bedtime.     . bisoprolol-hydrochlorothiazide (ZIAC) 5-6.25 MG per tablet Take 1 tablet by mouth daily. AM     . butalbital-acetaminophen-caffeine (FIORICET, ESGIC) 50-325-40 MG per tablet Take 1 tablet by mouth every 4 (four) hours as needed.     . chlorpheniramine-phenylephrine-dextromethorphan (CARDEC DM) 3.5-1-3 MG/ML solution Take 1 mL by mouth every 6 (six) hours as needed for cough. 120 mL 0   . diazepam (VALIUM) 10 MG tablet Take 30 minutes prior to the vasectomy (Patient not taking: Reported on 12/12/2016) 1 tablet 0   . diphenoxylate-atropine (LOMOTIL) 2.5-0.025 MG tablet Take by mouth 4 (four) times daily as needed for diarrhea or loose stools.     . Eluxadoline (VIBERZI) 100 MG TABS Take by mouth.     Marland Kitchen FLUoxetine (PROZAC) 40 MG capsule Take 2 capsules by mouth daily. PM     . gabapentin (NEURONTIN) 300 MG capsule Reported on 04/16/2016     .  HYDROcodone-acetaminophen (NORCO) 10-325 MG tablet Take 1 tablet by mouth every 6 (six) hours as needed. Reported on 04/16/2016     . lithium carbonate 300 MG capsule TAKE 2 CAPSULES (600 MG TOTAL) BY MOUTH 2 (TWO) TIMES DAILY.     Marland Kitchen omeprazole (PRILOSEC) 40 MG capsule Take 40 mg by mouth daily.     Marland Kitchen testosterone cypionate (DEPOTESTOTERONE CYPIONATE) 100 MG/ML injection Inject 200 mg into the muscle every 14 (fourteen) days. For IM use only        Allergies  Allergen Reactions  . Contrast Media [Iodinated Diagnostic Agents] Anaphylaxis  . Shellfish Allergy Anaphylaxis  . Latex Rash    Only when gloves are worn by pt.     Past Medical History:  Diagnosis Date  . Adopted   . Alcohol use   . Allergy   . Anxiety   . Arthritis   . Bell's palsy   . Bipolar affective ([place])   . Cancer ([place])    skin cancer  . Complication of anesthesia    slow to wake after back surgery  . Depression   . GERD (gastroesophageal reflux disease)   . Headache    migraines - 1/month  . History of kidney stones   . Hypertension   . Hypogonadism in male   . Nephrolithiasis   . Right-sided Bell's palsy 2015   resolved  . Sleep  apnea    has CPAP, doesn't use  . Substance abuse ([place])   . Testicular pain    left    Review of systems:  Otherwise negative.    Physical Exam  Gen: Alert, oriented. Appears stated age.  HEENT: /AT. PERRLA. Lungs: CTA, no wheezes. CV: RR nl S1, S2. Abd: soft, benign, no masses. BS+ Ext: No edema. Pulses 2+    Planned procedures: Proceed with EGD. The patient understands the nature of the planned procedure, indications, risks, alternatives and potential complications including but not limited to bleeding, infection, perforation, damage to internal organs and possible oversedation/side effects from anesthesia. The patient agrees and gives consent to proceed.  Please refer to procedure notes for findings, recommendations and patient disposition/instructions.       K. Alice Reichert, M.D. Gastroenterology 10/18/2019  12:36 PM

## 2019-10-18 NOTE — Anesthesia Postprocedure Evaluation (Signed)
Anesthesia Post Note  Patient: David Wheeler  Procedure(s) Performed: ESOPHAGOGASTRODUODENOSCOPY (EGD) WITH PROPOFOL (N/A )  Patient location during evaluation: Endoscopy Anesthesia Type: General Level of consciousness: awake and alert Pain management: pain level controlled Vital Signs Assessment: post-procedure vital signs reviewed and stable Respiratory status: spontaneous breathing, nonlabored ventilation, respiratory function stable and patient connected to nasal cannula oxygen Cardiovascular status: blood pressure returned to baseline and stable Postop Assessment: no apparent nausea or vomiting Anesthetic complications: no     Last Vitals:  Vitals:   10/18/19 1358 10/18/19 1406  BP: (!) 139/97 135/83  Pulse: 80 84  Resp: 17 15  Temp:    SpO2: 96% 96%    Last Pain:  Vitals:   10/18/19 1406  TempSrc:   PainSc: 0-No pain                 Martha Clan

## 2019-10-19 ENCOUNTER — Encounter: Payer: Self-pay | Admitting: Internal Medicine

## 2019-10-19 LAB — SURGICAL PATHOLOGY

## 2020-04-17 ENCOUNTER — Other Ambulatory Visit: Payer: Self-pay | Admitting: Internal Medicine

## 2020-04-17 DIAGNOSIS — R29810 Facial weakness: Secondary | ICD-10-CM

## 2020-04-17 DIAGNOSIS — R519 Headache, unspecified: Secondary | ICD-10-CM

## 2020-04-18 ENCOUNTER — Ambulatory Visit: Payer: Managed Care, Other (non HMO)

## 2020-04-19 ENCOUNTER — Other Ambulatory Visit: Payer: Self-pay

## 2020-04-19 ENCOUNTER — Ambulatory Visit
Admission: RE | Admit: 2020-04-19 | Discharge: 2020-04-19 | Disposition: A | Discharge status: Home / Self Care | Payer: Managed Care, Other (non HMO) | Source: Ambulatory Visit | Attending: Internal Medicine | Admitting: Internal Medicine

## 2020-04-19 DIAGNOSIS — R519 Headache, unspecified: Secondary | ICD-10-CM | POA: Insufficient documentation

## 2020-04-19 DIAGNOSIS — R29810 Facial weakness: Secondary | ICD-10-CM | POA: Diagnosis present

## 2020-10-05 ENCOUNTER — Other Ambulatory Visit
Admission: RE | Admit: 2020-10-05 | Discharge: 2020-10-05 | Disposition: A | Discharge status: Home / Self Care | Payer: Managed Care, Other (non HMO) | Source: Ambulatory Visit | Attending: Specialist | Admitting: Specialist

## 2020-10-05 DIAGNOSIS — R0602 Shortness of breath: Secondary | ICD-10-CM | POA: Diagnosis present

## 2020-10-05 LAB — FIBRIN DERIVATIVES D-DIMER (ARMC ONLY): Fibrin derivatives D-dimer (ARMC): 110.43 ng/mL (FEU) (ref 0.00–499.00)

## 2023-02-24 ENCOUNTER — Other Ambulatory Visit: Payer: Self-pay | Admitting: Family Medicine

## 2023-02-24 DIAGNOSIS — R1012 Left upper quadrant pain: Secondary | ICD-10-CM

## 2023-03-06 ENCOUNTER — Other Ambulatory Visit: Payer: Self-pay | Admitting: Family Medicine

## 2023-03-06 DIAGNOSIS — R1012 Left upper quadrant pain: Secondary | ICD-10-CM

## 2023-03-20 ENCOUNTER — Ambulatory Visit
Admission: RE | Admit: 2023-03-20 | Discharge: 2023-03-20 | Disposition: A | Discharge status: Home / Self Care | Payer: Managed Care, Other (non HMO) | Source: Ambulatory Visit | Attending: Family Medicine | Admitting: Family Medicine

## 2023-03-20 DIAGNOSIS — R1012 Left upper quadrant pain: Secondary | ICD-10-CM

## 2023-03-27 ENCOUNTER — Other Ambulatory Visit: Payer: Self-pay | Admitting: Family Medicine

## 2023-03-27 DIAGNOSIS — R918 Other nonspecific abnormal finding of lung field: Secondary | ICD-10-CM

## 2023-04-09 ENCOUNTER — Ambulatory Visit
Admission: RE | Admit: 2023-04-09 | Discharge: 2023-04-09 | Disposition: A | Discharge status: Home / Self Care | Payer: Managed Care, Other (non HMO) | Source: Ambulatory Visit | Attending: Family Medicine | Admitting: Family Medicine

## 2023-04-09 DIAGNOSIS — R918 Other nonspecific abnormal finding of lung field: Secondary | ICD-10-CM

## 2023-05-14 ENCOUNTER — Ambulatory Visit: Payer: Managed Care, Other (non HMO)

## 2023-05-14 DIAGNOSIS — K5730 Diverticulosis of large intestine without perforation or abscess without bleeding: Secondary | ICD-10-CM

## 2023-05-14 DIAGNOSIS — D125 Benign neoplasm of sigmoid colon: Secondary | ICD-10-CM | POA: Diagnosis not present

## 2023-05-19 ENCOUNTER — Other Ambulatory Visit: Payer: Self-pay | Admitting: Internal Medicine

## 2023-05-19 DIAGNOSIS — R0789 Other chest pain: Secondary | ICD-10-CM

## 2023-06-16 ENCOUNTER — Other Ambulatory Visit (HOSPITAL_COMMUNITY): Payer: Self-pay | Admitting: Emergency Medicine

## 2023-06-16 ENCOUNTER — Encounter (HOSPITAL_COMMUNITY): Payer: Self-pay

## 2023-06-16 DIAGNOSIS — R079 Chest pain, unspecified: Secondary | ICD-10-CM

## 2023-06-16 MED ORDER — PREDNISONE 50 MG PO TABS
ORAL_TABLET | ORAL | 0 refills | Status: AC
Start: 2023-06-16 — End: ?

## 2023-06-16 MED ORDER — METOPROLOL TARTRATE 100 MG PO TABS
100.00 mg | ORAL_TABLET | Freq: Once | ORAL | 0 refills | Status: AC
Start: 2023-06-16 — End: 2023-06-16

## 2023-06-16 MED ORDER — DIPHENHYDRAMINE HCL 50 MG PO CAPS
ORAL_CAPSULE | ORAL | 0 refills | Status: AC
Start: 2023-06-16 — End: ?

## 2023-06-17 ENCOUNTER — Telehealth (HOSPITAL_COMMUNITY): Payer: Self-pay | Admitting: Emergency Medicine

## 2023-06-17 NOTE — Telephone Encounter (Signed)
Attempted to call patient regarding upcoming cardiac CT appointment. °Left message on voicemail with name and callback number °Danyle Boening RN Navigator Cardiac Imaging °[facility] °[phone] Office °[phone] Cell ° °

## 2023-06-18 ENCOUNTER — Ambulatory Visit
Admission: RE | Admit: 2023-06-18 | Discharge: 2023-06-18 | Disposition: A | Discharge status: Home / Self Care | Payer: Managed Care, Other (non HMO) | Source: Ambulatory Visit | Attending: Internal Medicine | Admitting: Internal Medicine

## 2023-06-18 DIAGNOSIS — R0789 Other chest pain: Secondary | ICD-10-CM | POA: Diagnosis not present

## 2023-06-18 MED ORDER — IOHEXOL 350 MG/ML SOLN
100.0000 mL | Freq: Once | INTRAVENOUS | Status: AC | PRN
  Administered 2023-06-18: 100 mL via INTRAVENOUS

## 2023-06-18 MED ORDER — METOPROLOL TARTRATE 5 MG/5ML IV SOLN
INTRAVENOUS | Status: AC
  Filled 2023-06-18: qty 10

## 2023-06-18 MED ORDER — METOPROLOL TARTRATE 5 MG/5ML IV SOLN
10.0000 mg | Freq: Once | INTRAVENOUS | Status: AC
  Administered 2023-06-18: 5 mg via INTRAVENOUS

## 2023-06-18 MED ORDER — METOPROLOL TARTRATE 5 MG/5ML IV SOLN
5.0000 mg | Freq: Once | INTRAVENOUS | Status: AC
  Administered 2023-06-18: 5 mg via INTRAVENOUS
  Filled 2023-06-18: qty 5

## 2023-06-18 MED ORDER — NITROGLYCERIN 0.4 MG SL SUBL
SUBLINGUAL_TABLET | SUBLINGUAL | Status: AC
  Filled 2023-06-18: qty 2

## 2023-06-18 MED ORDER — NITROGLYCERIN 0.4 MG SL SUBL
0.8000 mg | SUBLINGUAL_TABLET | Freq: Once | SUBLINGUAL | Status: AC
  Administered 2023-06-18: 0.8 mg via SUBLINGUAL

## 2023-06-18 NOTE — Progress Notes (Signed)
Patient tolerated procedure well. Ambulate w/o difficulty. Denies any lightheadedness or being dizzy. Pt denies any pain at this time. Sitting in chair, pt is encouraged to drink additional water throughout the day and reason explained to patient. Patient verbalized understanding and all questions answered. ABC intact. No further needs at this time. Discharge from procedure area w/o issues.  

## 2023-10-07 ENCOUNTER — Other Ambulatory Visit: Payer: Self-pay | Admitting: Specialist

## 2023-10-07 DIAGNOSIS — R918 Other nonspecific abnormal finding of lung field: Secondary | ICD-10-CM

## 2024-02-27 ENCOUNTER — Other Ambulatory Visit: Payer: Self-pay | Admitting: Specialist

## 2024-02-27 DIAGNOSIS — R918 Other nonspecific abnormal finding of lung field: Secondary | ICD-10-CM
